# Patient Record
Sex: Female | Born: 1940
Health system: Southern US, Community
[De-identification: ages and names within clinical notes are randomized; demographics above are authoritative.]

## PROBLEM LIST (undated history)

## (undated) DIAGNOSIS — D229 Melanocytic nevi, unspecified: Secondary | ICD-10-CM

## (undated) DIAGNOSIS — D039 Melanoma in situ, unspecified: Secondary | ICD-10-CM

## (undated) DIAGNOSIS — M1990 Unspecified osteoarthritis, unspecified site: Secondary | ICD-10-CM

## (undated) DIAGNOSIS — C4491 Basal cell carcinoma of skin, unspecified: Secondary | ICD-10-CM

## (undated) DIAGNOSIS — C439 Malignant melanoma of skin, unspecified: Secondary | ICD-10-CM

## (undated) DIAGNOSIS — E785 Hyperlipidemia, unspecified: Secondary | ICD-10-CM

## (undated) DIAGNOSIS — I10 Essential (primary) hypertension: Secondary | ICD-10-CM

## (undated) HISTORY — DX: Basal cell carcinoma of skin, unspecified: C44.91

## (undated) HISTORY — DX: Essential (primary) hypertension: I10

## (undated) HISTORY — PX: REPLACEMENT TOTAL HIP W/  RESURFACING IMPLANTS: SUR1222

## (undated) HISTORY — DX: Unspecified osteoarthritis, unspecified site: M19.90

## (undated) HISTORY — PX: REPLACEMENT TOTAL KNEE: SUR1224

## (undated) HISTORY — DX: Hyperlipidemia, unspecified: E78.5

---
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
## 1963-06-06 HISTORY — PX: TONSILLECTOMY: SUR1361

## 1980-06-05 HISTORY — PX: DEBRIDEMENT TENNIS ELBOW: SHX1442

## 1997-07-21 ENCOUNTER — Other Ambulatory Visit: Admission: RE | Admit: 1997-07-21 | Discharge: 1997-07-21 | Payer: Self-pay | Admitting: Gynecology

## 1998-08-04 ENCOUNTER — Other Ambulatory Visit: Admission: RE | Admit: 1998-08-04 | Discharge: 1998-08-04 | Payer: Self-pay | Admitting: Gynecology

## 1999-07-06 ENCOUNTER — Encounter: Admission: RE | Admit: 1999-07-06 | Discharge: 1999-07-06 | Payer: Self-pay | Admitting: Gynecology

## 1999-07-06 ENCOUNTER — Encounter: Payer: Self-pay | Admitting: Gynecology

## 1999-07-18 ENCOUNTER — Other Ambulatory Visit: Admission: RE | Admit: 1999-07-18 | Discharge: 1999-07-18 | Payer: Self-pay | Admitting: Gynecology

## 1999-07-26 DIAGNOSIS — D039 Melanoma in situ, unspecified: Secondary | ICD-10-CM

## 1999-07-26 HISTORY — DX: Melanoma in situ, unspecified: D03.9

## 2000-07-09 ENCOUNTER — Encounter: Payer: Self-pay | Admitting: Gynecology

## 2000-07-09 ENCOUNTER — Encounter: Admission: RE | Admit: 2000-07-09 | Discharge: 2000-07-09 | Payer: Self-pay | Admitting: Gynecology

## 2000-07-26 ENCOUNTER — Other Ambulatory Visit: Admission: RE | Admit: 2000-07-26 | Discharge: 2000-07-26 | Payer: Self-pay | Admitting: Gynecology

## 2001-06-11 ENCOUNTER — Ambulatory Visit (HOSPITAL_COMMUNITY): Admission: RE | Admit: 2001-06-11 | Discharge: 2001-06-11 | Payer: Self-pay | Admitting: Internal Medicine

## 2001-06-11 ENCOUNTER — Encounter: Payer: Self-pay | Admitting: Internal Medicine

## 2001-07-11 ENCOUNTER — Encounter: Admission: RE | Admit: 2001-07-11 | Discharge: 2001-07-11 | Payer: Self-pay | Admitting: Gynecology

## 2001-07-11 ENCOUNTER — Encounter: Payer: Self-pay | Admitting: Gynecology

## 2001-11-12 ENCOUNTER — Other Ambulatory Visit: Admission: RE | Admit: 2001-11-12 | Discharge: 2001-11-12 | Payer: Self-pay | Admitting: Gynecology

## 2002-07-14 ENCOUNTER — Encounter: Payer: Self-pay | Admitting: Gynecology

## 2002-07-14 ENCOUNTER — Encounter: Admission: RE | Admit: 2002-07-14 | Discharge: 2002-07-14 | Payer: Self-pay | Admitting: Gynecology

## 2002-12-16 ENCOUNTER — Other Ambulatory Visit: Admission: RE | Admit: 2002-12-16 | Discharge: 2002-12-16 | Payer: Self-pay | Admitting: Gynecology

## 2003-07-16 ENCOUNTER — Encounter: Admission: RE | Admit: 2003-07-16 | Discharge: 2003-07-16 | Payer: Self-pay | Admitting: Gynecology

## 2003-12-21 ENCOUNTER — Other Ambulatory Visit: Admission: RE | Admit: 2003-12-21 | Discharge: 2003-12-21 | Payer: Self-pay | Admitting: Gynecology

## 2004-07-27 ENCOUNTER — Encounter: Admission: RE | Admit: 2004-07-27 | Discharge: 2004-07-27 | Payer: Self-pay | Admitting: Gynecology

## 2005-01-05 ENCOUNTER — Other Ambulatory Visit: Admission: RE | Admit: 2005-01-05 | Discharge: 2005-01-05 | Payer: Self-pay | Admitting: Gynecology

## 2005-04-19 ENCOUNTER — Ambulatory Visit (HOSPITAL_COMMUNITY): Admission: RE | Admit: 2005-04-19 | Discharge: 2005-04-19 | Payer: Self-pay | Admitting: Internal Medicine

## 2005-07-10 ENCOUNTER — Other Ambulatory Visit: Admission: RE | Admit: 2005-07-10 | Discharge: 2005-07-10 | Payer: Self-pay | Admitting: Gynecology

## 2005-07-28 ENCOUNTER — Encounter: Admission: RE | Admit: 2005-07-28 | Discharge: 2005-07-28 | Payer: Self-pay | Admitting: Gynecology

## 2005-09-06 ENCOUNTER — Ambulatory Visit (HOSPITAL_BASED_OUTPATIENT_CLINIC_OR_DEPARTMENT_OTHER): Admission: RE | Admit: 2005-09-06 | Discharge: 2005-09-06 | Payer: Self-pay | Admitting: Orthopedic Surgery

## 2005-09-06 ENCOUNTER — Encounter (INDEPENDENT_AMBULATORY_CARE_PROVIDER_SITE_OTHER): Payer: Self-pay | Admitting: *Deleted

## 2006-07-30 ENCOUNTER — Encounter: Admission: RE | Admit: 2006-07-30 | Discharge: 2006-07-30 | Payer: Self-pay | Admitting: Gynecology

## 2007-01-28 ENCOUNTER — Ambulatory Visit (HOSPITAL_COMMUNITY): Admission: RE | Admit: 2007-01-28 | Discharge: 2007-01-28 | Payer: Self-pay | Admitting: Gynecology

## 2007-08-06 ENCOUNTER — Encounter: Admission: RE | Admit: 2007-08-06 | Discharge: 2007-08-06 | Payer: Self-pay | Admitting: Gynecology

## 2008-06-05 HISTORY — PX: GANGLION CYST EXCISION: SHX1691

## 2008-08-07 ENCOUNTER — Encounter: Admission: RE | Admit: 2008-08-07 | Discharge: 2008-08-07 | Payer: Self-pay | Admitting: Internal Medicine

## 2009-08-10 ENCOUNTER — Encounter: Admission: RE | Admit: 2009-08-10 | Discharge: 2009-08-10 | Payer: Self-pay | Admitting: Internal Medicine

## 2010-06-26 ENCOUNTER — Encounter: Payer: Self-pay | Admitting: Gynecology

## 2010-07-25 ENCOUNTER — Other Ambulatory Visit: Payer: Self-pay | Admitting: Gynecology

## 2010-07-25 DIAGNOSIS — Z1231 Encounter for screening mammogram for malignant neoplasm of breast: Secondary | ICD-10-CM

## 2010-08-12 ENCOUNTER — Ambulatory Visit
Admission: RE | Admit: 2010-08-12 | Discharge: 2010-08-12 | Disposition: A | Discharge status: Home / Self Care | Payer: Medicare Other | Source: Ambulatory Visit | Attending: Gynecology | Admitting: Gynecology

## 2010-08-12 DIAGNOSIS — Z1231 Encounter for screening mammogram for malignant neoplasm of breast: Secondary | ICD-10-CM

## 2010-10-21 NOTE — Op Note (Signed)
NAMEJAMISEN, Ruth Murray             ACCOUNT NO.:  [id number]   MEDICAL RECORD NO.:  [id number]          PATIENT TYPE:  AMB   LOCATION:  DSC                          FACILITY:  [facility]   PHYSICIAN:  Cindee Salt, M.D.       DATE OF BIRTH:  1940/08/31   DATE OF PROCEDURE:  DATE OF DISCHARGE:                               OPERATIVE REPORT   PREOPERATIVE DIAGNOSIS:  Suzette Battiest with cyst, left wrist.   POSTOPERATIVE DIAGNOSIS:  Suzette Battiest with cyst, left wrist.   OPERATION:  Excision, cyst, release of first dorsal compartment, left  wrist.   SURGEON:  Cindee Salt, M.D.   ASSISTANTCarolyne Fiscal, R.N.   ANESTHESIA:  Upper arm IV regional.   DATE OF OPERATION:  09/06/2005.   HISTORY:  The patient is a 71 year old female with a history of de  Quervain's tendinitis.  She has developed multiple recurrence after  injections and a cyst on the dorsal aspect of the first dorsal  compartment.  She has not seen any relief with conservative treatment.  She is brought in for release and excision of the cyst.  This will be  performed as an outpatient under regional anesthesia.  Questions are  answered, prior to surgery the area marked by the patient and surgeon.   PROCEDURE:  The patient is brought to the operating room where an upper  arm IV regional anesthetic was carried out without difficulty.  She was  prepped using DuraPrep, supine position, left arm free.  A longitudinal  incision was made directly over the first dorsal compartment,  carried  down through subcutaneous tissue.  Bleeders were electrocauterized.  The  radial nerve was identified and protected.  Significant scarring was  present about the cyst. With blunt and sharp dissection this was  dissected free protecting the overlying structures.  The cyst was then  excised, sent to pathology.  Following the stalk down into the first  dorsal compartment, the first dorsal compartment was found to be  extremely thickened.  This was released  on its most dorsal aspect.  Significant tenosynovitis was present.  This area was debrided. Some  fraying of the tendons was noted.  This area was also debrided. The  wound was then irrigated. The compartment was then reapproximated over  the tendons and the skin closed with a subcuticular 4-0 Monocryl suture.  Steri-Strips were applied.  A sterile compressive dressing and thumb  spica splint applied.  The patient tolerated the procedure well and was  taken to the recovery for observation in satisfactory condition.  She is  discharged home to return to the [facility] in 1 week on  Vicodin.           ______________________________  Cindee Salt, M.D.    GK/MEDQ  D:  09/06/2005  T:  09/06/2005  Job:  [id number]

## 2010-10-21 NOTE — Procedures (Signed)
[facility]  Patient:    Ruth Murray, Ruth Murray Visit Number: [id number] MRN: [id number]          Service Type: OUT Location: RAD Attending Physician:  Carylon Perches Dictated by:   Carylon Perches, M.D. Proc. Date: 06/11/01 Admit Date:  06/11/2001                                Stress Test  PROCEDURE:  Cardiolite stress test.  OPERATOR:  Dr. Carylon Perches.  DESCRIPTION OF PROCEDURE:  Ms. Hackenberg exercised 10 minutes and 20 seconds (one minute and 20 seconds at stage 4 of the Bruce protocol), attaining a maximal heart rate of 160 (99% of the age predicted maximal heart rate), at a work load of 12.9 METS, and discontinued exercise due to fatigue.  There are no symptoms of chest pain.  There were no arrhythmias.  There were no ST segment changes diagnostic of ischemia.  IMPRESSION:  No evidence of exercise-induced ischemia.  Cardiolite images pending. Dictated by:   Carylon Perches, M.D. Attending Physician:  Carylon Perches DD:  06/11/01 TD:  06/11/01 Job: [id number]

## 2011-04-20 ENCOUNTER — Other Ambulatory Visit: Payer: Self-pay | Admitting: Gynecology

## 2011-07-21 ENCOUNTER — Other Ambulatory Visit: Payer: Self-pay | Admitting: Gynecology

## 2011-07-21 DIAGNOSIS — Z1231 Encounter for screening mammogram for malignant neoplasm of breast: Secondary | ICD-10-CM

## 2011-08-14 ENCOUNTER — Ambulatory Visit: Payer: BC Managed Care – PPO

## 2011-08-15 ENCOUNTER — Ambulatory Visit
Admission: RE | Admit: 2011-08-15 | Discharge: 2011-08-15 | Disposition: A | Discharge status: Home / Self Care | Payer: BC Managed Care – PPO | Source: Ambulatory Visit | Attending: Gynecology | Admitting: Gynecology

## 2011-08-15 DIAGNOSIS — Z1231 Encounter for screening mammogram for malignant neoplasm of breast: Secondary | ICD-10-CM

## 2011-12-15 ENCOUNTER — Encounter: Payer: Self-pay | Admitting: Internal Medicine

## 2012-01-26 ENCOUNTER — Ambulatory Visit (AMBULATORY_SURGERY_CENTER): Payer: Medicare Other | Admitting: *Deleted

## 2012-01-26 VITALS — Ht 67.0 in | Wt 149.0 lb

## 2012-01-26 DIAGNOSIS — Z1211 Encounter for screening for malignant neoplasm of colon: Secondary | ICD-10-CM

## 2012-01-26 MED ORDER — MOVIPREP 100 G PO SOLR: ORAL | Status: DC

## 2012-02-09 ENCOUNTER — Ambulatory Visit (AMBULATORY_SURGERY_CENTER): Payer: Medicare Other | Admitting: Internal Medicine

## 2012-02-09 ENCOUNTER — Encounter: Payer: Self-pay | Admitting: Internal Medicine

## 2012-02-09 VITALS — BP 148/84 | HR 60 | Temp 98.1°F | Resp 16 | Ht 67.0 in | Wt 149.0 lb

## 2012-02-09 DIAGNOSIS — Z1211 Encounter for screening for malignant neoplasm of colon: Secondary | ICD-10-CM

## 2012-02-09 MED ORDER — SODIUM CHLORIDE 0.9 % IV SOLN: 500.0000 mL | INTRAVENOUS | Status: DC

## 2012-02-09 NOTE — Progress Notes (Signed)
Propofol given and oxygen managed per CRNA

## 2012-02-09 NOTE — Op Note (Signed)
[facility] [address] [place], [address]   COLONOSCOPY PROCEDURE REPORT  PATIENT: Ruth, Murray  MR#: [id number] BIRTHDATE: 11/30/1940 , 71  yrs. old GENDER: Female ENDOSCOPIST: Hart Carwin, MD REFERRED XB:JYNW Hall, M.D.  ,Dr Lodema Hong. PROCEDURE DATE:  02/09/2012 PROCEDURE:   Colonoscopy, screening ASA CLASS:   Class II INDICATIONS:screening MEDICATIONS: MAC sedation, administered by CRNA and Propofol (Diprivan) 200 mg IV  DESCRIPTION OF PROCEDURE:   After the risks benefits and alternatives of the procedure were thoroughly explained, informed consent was obtained.  A digital rectal exam revealed no abnormalities of the rectum.   The LB PCF-H180AL [id number]  endoscope was introduced through the anus and advanced to the cecum, which was identified by both the appendix and ileocecal valve. No adverse events experienced.   The quality of the prep was good, using MoviPrep  The instrument was then slowly withdrawn as the colon was fully examined.      COLON FINDINGS: A normal appearing cecum, ileocecal valve, and appendiceal orifice were identified.  The ascending, hepatic flexure, transverse, splenic flexure, descending, sigmoid colon and rectum appeared unremarkable.  No polyps or cancers were seen. There were few scattered diverticuli in the sigmoid colon Retroflexed views revealed no abnormalities.  .      The scope was withdrawn and the procedure completed. COMPLICATIONS: There were no complications.  ENDOSCOPIC IMPRESSION: Normal colon , minimal sigmoid diverticulosis  RECOMMENDATIONS: High fiber diet Recall colonoscopy suggested 10 years if medically stable  eSigned:  Hart Carwin, MD 02/09/2012 1:03 PM   cc:

## 2012-02-09 NOTE — Progress Notes (Signed)
Patient did not experience any of the following events: a burn prior to discharge; a fall within the facility; wrong site/side/patient/procedure/implant event; or a hospital transfer or hospital admission upon discharge from the facility. (G8907) Patient did not have preoperative order for IV antibiotic SSI prophylaxis. (G8918)  

## 2012-02-09 NOTE — Patient Instructions (Addendum)

## 2012-02-12 ENCOUNTER — Telehealth: Payer: Self-pay | Admitting: *Deleted

## 2012-02-12 NOTE — Telephone Encounter (Signed)
Message left

## 2012-03-09 ENCOUNTER — Other Ambulatory Visit: Payer: Self-pay | Admitting: Internal Medicine

## 2012-04-25 ENCOUNTER — Other Ambulatory Visit (HOSPITAL_COMMUNITY): Payer: Self-pay | Admitting: Internal Medicine

## 2012-04-25 DIAGNOSIS — Z139 Encounter for screening, unspecified: Secondary | ICD-10-CM

## 2012-06-12 DIAGNOSIS — D229 Melanocytic nevi, unspecified: Secondary | ICD-10-CM

## 2012-06-12 HISTORY — DX: Melanocytic nevi, unspecified: D22.9

## 2012-07-09 ENCOUNTER — Other Ambulatory Visit (HOSPITAL_COMMUNITY): Payer: Self-pay | Admitting: Gynecology

## 2012-07-09 DIAGNOSIS — M810 Age-related osteoporosis without current pathological fracture: Secondary | ICD-10-CM

## 2012-07-11 ENCOUNTER — Other Ambulatory Visit: Payer: Self-pay | Admitting: Gynecology

## 2012-07-11 DIAGNOSIS — Z1231 Encounter for screening mammogram for malignant neoplasm of breast: Secondary | ICD-10-CM

## 2012-07-15 ENCOUNTER — Ambulatory Visit (HOSPITAL_COMMUNITY)
Admission: RE | Admit: 2012-07-15 | Discharge: 2012-07-15 | Disposition: A | Discharge status: Home / Self Care | Payer: Medicare Other | Source: Ambulatory Visit | Attending: Gynecology | Admitting: Gynecology

## 2012-07-15 DIAGNOSIS — M899 Disorder of bone, unspecified: Secondary | ICD-10-CM | POA: Insufficient documentation

## 2012-07-15 DIAGNOSIS — M810 Age-related osteoporosis without current pathological fracture: Secondary | ICD-10-CM

## 2012-07-15 DIAGNOSIS — Z780 Asymptomatic menopausal state: Secondary | ICD-10-CM | POA: Insufficient documentation

## 2012-08-16 ENCOUNTER — Ambulatory Visit
Admission: RE | Admit: 2012-08-16 | Discharge: 2012-08-16 | Disposition: A | Discharge status: Home / Self Care | Payer: Medicare Other | Source: Ambulatory Visit | Attending: Gynecology | Admitting: Gynecology

## 2012-08-16 DIAGNOSIS — Z1231 Encounter for screening mammogram for malignant neoplasm of breast: Secondary | ICD-10-CM

## 2012-09-12 ENCOUNTER — Ambulatory Visit (INDEPENDENT_AMBULATORY_CARE_PROVIDER_SITE_OTHER): Payer: Medicare Other | Admitting: Otolaryngology

## 2012-09-12 DIAGNOSIS — H903 Sensorineural hearing loss, bilateral: Secondary | ICD-10-CM

## 2012-12-04 ENCOUNTER — Ambulatory Visit (HOSPITAL_COMMUNITY)
Admission: RE | Admit: 2012-12-04 | Discharge: 2012-12-04 | Disposition: A | Discharge status: Home / Self Care | Payer: Medicare Other | Source: Ambulatory Visit | Attending: *Deleted | Admitting: *Deleted

## 2012-12-04 ENCOUNTER — Other Ambulatory Visit (HOSPITAL_COMMUNITY): Payer: Self-pay | Admitting: *Deleted

## 2012-12-04 DIAGNOSIS — T1490XA Injury, unspecified, initial encounter: Secondary | ICD-10-CM

## 2012-12-04 DIAGNOSIS — M79609 Pain in unspecified limb: Secondary | ICD-10-CM | POA: Insufficient documentation

## 2012-12-04 DIAGNOSIS — S6990XA Unspecified injury of unspecified wrist, hand and finger(s), initial encounter: Secondary | ICD-10-CM | POA: Insufficient documentation

## 2012-12-04 DIAGNOSIS — S6980XA Other specified injuries of unspecified wrist, hand and finger(s), initial encounter: Secondary | ICD-10-CM | POA: Insufficient documentation

## 2012-12-04 DIAGNOSIS — X58XXXA Exposure to other specified factors, initial encounter: Secondary | ICD-10-CM | POA: Insufficient documentation

## 2013-01-01 ENCOUNTER — Other Ambulatory Visit (HOSPITAL_COMMUNITY): Payer: Self-pay | Admitting: Internal Medicine

## 2013-01-01 DIAGNOSIS — M549 Dorsalgia, unspecified: Secondary | ICD-10-CM

## 2013-01-01 DIAGNOSIS — IMO0002 Reserved for concepts with insufficient information to code with codable children: Secondary | ICD-10-CM

## 2013-01-02 ENCOUNTER — Other Ambulatory Visit: Payer: Self-pay | Admitting: Internal Medicine

## 2013-01-02 DIAGNOSIS — IMO0002 Reserved for concepts with insufficient information to code with codable children: Secondary | ICD-10-CM

## 2013-01-02 DIAGNOSIS — M549 Dorsalgia, unspecified: Secondary | ICD-10-CM

## 2013-01-03 ENCOUNTER — Ambulatory Visit (HOSPITAL_COMMUNITY): Payer: Medicare Other

## 2013-01-11 ENCOUNTER — Ambulatory Visit
Admission: RE | Admit: 2013-01-11 | Discharge: 2013-01-11 | Disposition: A | Discharge status: Home / Self Care | Payer: Medicare Other | Source: Ambulatory Visit | Attending: Internal Medicine | Admitting: Internal Medicine

## 2013-01-11 DIAGNOSIS — IMO0002 Reserved for concepts with insufficient information to code with codable children: Secondary | ICD-10-CM

## 2013-01-11 DIAGNOSIS — M549 Dorsalgia, unspecified: Secondary | ICD-10-CM

## 2013-07-23 ENCOUNTER — Other Ambulatory Visit: Payer: Self-pay

## 2013-07-23 DIAGNOSIS — Z1231 Encounter for screening mammogram for malignant neoplasm of breast: Secondary | ICD-10-CM

## 2013-08-19 ENCOUNTER — Ambulatory Visit: Payer: Medicare Other

## 2013-09-03 ENCOUNTER — Ambulatory Visit: Payer: Medicare Other

## 2013-09-09 ENCOUNTER — Ambulatory Visit
Admission: RE | Admit: 2013-09-09 | Discharge: 2013-09-09 | Disposition: A | Discharge status: Home / Self Care | Payer: 59 | Source: Ambulatory Visit

## 2013-09-09 DIAGNOSIS — Z1231 Encounter for screening mammogram for malignant neoplasm of breast: Secondary | ICD-10-CM

## 2014-06-04 ENCOUNTER — Other Ambulatory Visit (HOSPITAL_COMMUNITY): Payer: Self-pay | Admitting: Internal Medicine

## 2014-06-04 DIAGNOSIS — M8580 Other specified disorders of bone density and structure, unspecified site: Secondary | ICD-10-CM

## 2014-07-16 ENCOUNTER — Ambulatory Visit (HOSPITAL_COMMUNITY)
Admission: RE | Admit: 2014-07-16 | Discharge: 2014-07-16 | Disposition: A | Discharge status: Home / Self Care | Payer: Medicare Other | Source: Ambulatory Visit | Attending: Internal Medicine | Admitting: Internal Medicine

## 2014-07-16 DIAGNOSIS — M899 Disorder of bone, unspecified: Secondary | ICD-10-CM | POA: Diagnosis not present

## 2014-07-16 DIAGNOSIS — M8580 Other specified disorders of bone density and structure, unspecified site: Secondary | ICD-10-CM

## 2014-08-03 ENCOUNTER — Other Ambulatory Visit: Payer: Self-pay

## 2014-08-03 DIAGNOSIS — Z1231 Encounter for screening mammogram for malignant neoplasm of breast: Secondary | ICD-10-CM

## 2014-09-11 ENCOUNTER — Ambulatory Visit
Admission: RE | Admit: 2014-09-11 | Discharge: 2014-09-11 | Disposition: A | Discharge status: Home / Self Care | Payer: Medicare Other | Source: Ambulatory Visit

## 2014-09-11 DIAGNOSIS — Z1231 Encounter for screening mammogram for malignant neoplasm of breast: Secondary | ICD-10-CM

## 2015-02-04 ENCOUNTER — Ambulatory Visit (INDEPENDENT_AMBULATORY_CARE_PROVIDER_SITE_OTHER): Payer: Medicare Other | Admitting: Otolaryngology

## 2015-02-04 DIAGNOSIS — H903 Sensorineural hearing loss, bilateral: Secondary | ICD-10-CM

## 2015-02-04 DIAGNOSIS — H6121 Impacted cerumen, right ear: Secondary | ICD-10-CM

## 2015-02-12 ENCOUNTER — Other Ambulatory Visit (HOSPITAL_COMMUNITY): Payer: Self-pay | Admitting: Internal Medicine

## 2015-02-12 DIAGNOSIS — M25562 Pain in left knee: Secondary | ICD-10-CM

## 2015-02-19 ENCOUNTER — Ambulatory Visit (HOSPITAL_COMMUNITY): Admission: RE | Admit: 2015-02-19 | Payer: Medicare Other | Source: Ambulatory Visit

## 2015-02-22 ENCOUNTER — Ambulatory Visit (HOSPITAL_COMMUNITY)
Admission: RE | Admit: 2015-02-22 | Discharge: 2015-02-22 | Disposition: A | Discharge status: Home / Self Care | Payer: Medicare Other | Source: Ambulatory Visit | Attending: Internal Medicine | Admitting: Internal Medicine

## 2015-02-22 DIAGNOSIS — M25462 Effusion, left knee: Secondary | ICD-10-CM | POA: Diagnosis not present

## 2015-02-22 DIAGNOSIS — M2240 Chondromalacia patellae, unspecified knee: Secondary | ICD-10-CM | POA: Insufficient documentation

## 2015-02-22 DIAGNOSIS — M25562 Pain in left knee: Secondary | ICD-10-CM | POA: Diagnosis present

## 2015-02-22 DIAGNOSIS — M23222 Derangement of posterior horn of medial meniscus due to old tear or injury, left knee: Secondary | ICD-10-CM | POA: Insufficient documentation

## 2015-02-22 DIAGNOSIS — M7122 Synovial cyst of popliteal space [Baker], left knee: Secondary | ICD-10-CM | POA: Diagnosis not present

## 2015-03-04 ENCOUNTER — Inpatient Hospital Stay (HOSPITAL_COMMUNITY): Admission: RE | Admit: 2015-03-04 | Payer: Medicare Other | Source: Ambulatory Visit

## 2015-05-18 ENCOUNTER — Other Ambulatory Visit: Payer: Self-pay | Admitting: Dermatology

## 2015-08-18 ENCOUNTER — Other Ambulatory Visit: Payer: Self-pay

## 2015-08-18 DIAGNOSIS — Z1231 Encounter for screening mammogram for malignant neoplasm of breast: Secondary | ICD-10-CM

## 2015-09-13 ENCOUNTER — Ambulatory Visit: Payer: Medicare Other

## 2015-09-23 ENCOUNTER — Ambulatory Visit
Admission: RE | Admit: 2015-09-23 | Discharge: 2015-09-23 | Disposition: A | Discharge status: Home / Self Care | Payer: Medicare Other | Source: Ambulatory Visit

## 2015-09-23 DIAGNOSIS — Z1231 Encounter for screening mammogram for malignant neoplasm of breast: Secondary | ICD-10-CM

## 2015-09-27 ENCOUNTER — Other Ambulatory Visit: Payer: Self-pay | Admitting: Obstetrics & Gynecology

## 2015-09-27 DIAGNOSIS — R928 Other abnormal and inconclusive findings on diagnostic imaging of breast: Secondary | ICD-10-CM

## 2015-10-05 ENCOUNTER — Ambulatory Visit
Admission: RE | Admit: 2015-10-05 | Discharge: 2015-10-05 | Disposition: A | Discharge status: Home / Self Care | Payer: Medicare Other | Source: Ambulatory Visit | Attending: Obstetrics & Gynecology | Admitting: Obstetrics & Gynecology

## 2015-10-05 DIAGNOSIS — R928 Other abnormal and inconclusive findings on diagnostic imaging of breast: Secondary | ICD-10-CM

## 2016-08-07 ENCOUNTER — Other Ambulatory Visit: Payer: Self-pay | Admitting: Obstetrics & Gynecology

## 2016-08-08 LAB — CYTOLOGY - PAP

## 2016-09-12 ENCOUNTER — Other Ambulatory Visit: Payer: Self-pay | Admitting: Internal Medicine

## 2016-09-12 DIAGNOSIS — Z1231 Encounter for screening mammogram for malignant neoplasm of breast: Secondary | ICD-10-CM

## 2016-09-29 ENCOUNTER — Ambulatory Visit
Admission: RE | Admit: 2016-09-29 | Discharge: 2016-09-29 | Disposition: A | Discharge status: Home / Self Care | Payer: Medicare Other | Source: Ambulatory Visit | Attending: Internal Medicine | Admitting: Internal Medicine

## 2016-09-29 DIAGNOSIS — Z1231 Encounter for screening mammogram for malignant neoplasm of breast: Secondary | ICD-10-CM

## 2016-10-09 ENCOUNTER — Ambulatory Visit (INDEPENDENT_AMBULATORY_CARE_PROVIDER_SITE_OTHER): Payer: Medicare Other | Admitting: Otolaryngology

## 2016-10-09 DIAGNOSIS — H6121 Impacted cerumen, right ear: Secondary | ICD-10-CM

## 2017-07-12 ENCOUNTER — Other Ambulatory Visit: Payer: Self-pay | Admitting: Internal Medicine

## 2017-07-12 DIAGNOSIS — Z1231 Encounter for screening mammogram for malignant neoplasm of breast: Secondary | ICD-10-CM

## 2017-09-05 ENCOUNTER — Ambulatory Visit (INDEPENDENT_AMBULATORY_CARE_PROVIDER_SITE_OTHER): Payer: Medicare Other | Admitting: Orthopaedic Surgery

## 2017-09-05 ENCOUNTER — Ambulatory Visit (INDEPENDENT_AMBULATORY_CARE_PROVIDER_SITE_OTHER): Payer: Self-pay

## 2017-09-05 ENCOUNTER — Encounter (INDEPENDENT_AMBULATORY_CARE_PROVIDER_SITE_OTHER): Payer: Self-pay | Admitting: Orthopaedic Surgery

## 2017-09-05 VITALS — BP 132/60 | HR 56 | Resp 16 | Ht 66.0 in | Wt 144.0 lb

## 2017-09-05 DIAGNOSIS — M79601 Pain in right arm: Secondary | ICD-10-CM

## 2017-09-05 MED ORDER — BUPIVACAINE HCL 0.5 % IJ SOLN
2.0000 mL | INTRAMUSCULAR | Status: AC | PRN
  Administered 2017-09-05: 2 mL via INTRA_ARTICULAR

## 2017-09-05 MED ORDER — METHYLPREDNISOLONE ACETATE 40 MG/ML IJ SUSP
80.0000 mg | INTRAMUSCULAR | Status: AC | PRN
  Administered 2017-09-05: 80 mg

## 2017-09-05 MED ORDER — LIDOCAINE HCL 2 % IJ SOLN
2.0000 mL | INTRAMUSCULAR | Status: AC | PRN
  Administered 2017-09-05: 2 mL

## 2017-09-05 NOTE — Progress Notes (Signed)
Office Visit Note   Patient: Ruth Murray           Date of Birth: May 31, 1941           MRN: [id number] Visit Date: 09/05/2017              Requested by: Ruth Squibb, MD [place], [place] [address] PCP: Ruth Squibb, MD   Assessment & Plan: Visit Diagnoses:  1. Pain of right upper extremity     Plan: Right shoulder pain with insidious onset approximately 2 months ago with multiple diagnostic possibilities.  Will try subacromial cortisone injection and monitor her response.  Consider MRI scan if  minimal response  Follow-Up Instructions: Return if symptoms worsen or fail to improve.   Orders:  Orders Placed This Encounter  Procedures  . Large Joint Inj: R subacromial bursa  . XR Shoulder Right   No orders of the defined types were placed in this encounter.     Procedures: Large Joint Inj: R subacromial bursa on 09/05/2017 11:33 AM Indications: pain and diagnostic evaluation Details: 25 G 1.5 in needle, anterolateral approach  Arthrogram: No  Medications: 2 mL lidocaine 2 %; 2 mL bupivacaine 0.5 %; 80 mg methylPREDNISolone acetate 40 MG/ML Consent was given by the patient. Immediately prior to procedure a time out was called to verify the correct patient, procedure, equipment, support staff and site/side marked as required. Patient was prepped and draped in the usual sterile fashion.       Clinical Data: No additional findings.   Subjective: Chief Complaint  Patient presents with  . Right Arm - Pain    PAIN FOR 2 MONTHS POSSIBLY FROM PUSHING FILES AROUND, PAINFUL TO REACH UNABLE TO SLEEP ON IT  . New Patient (Initial Visit)    PAIN FOR 2 MONTHS POSSIBLY FROM PUSHING FILES AROUND, PAINFUL TO REACH UNABLE TO SLEEP ON IT  Ruth Murray relates insidious onset of right shoulder pain approximately 2 months ago.  She notes that she was pushing on some files that may have contributed to her pain but no obvious injury or trauma.  She is having  difficulty reaching "back" and has difficulty sleeping on her shoulder.  Tried ice and heat and meloxicam without much relief.  He denies any neck pain.  She has not had any numbness or tingling  HPI  Review of Systems  Constitutional: Negative for fatigue and fever.  HENT: Negative for ear pain.   Eyes: Negative for pain.  Respiratory: Negative for cough and shortness of breath.   Cardiovascular: Negative for leg swelling.  Gastrointestinal: Negative for blood in stool and constipation.  Genitourinary: Negative for difficulty urinating.  Musculoskeletal: Positive for back pain. Negative for neck pain.  Skin: Negative for rash and wound.  Allergic/Immunologic: Negative for food allergies.  Neurological: Negative for dizziness, weakness, light-headedness, numbness and headaches.  Hematological: Does not bruise/bleed easily.  Psychiatric/Behavioral: Positive for sleep disturbance.     Objective: Vital Signs: BP 132/60 (BP Location: Left Arm, Patient Position: Sitting, Cuff Size: Normal)   Pulse (!) 56   Resp 16   Ht 5\' 6"  (1.676 m)   Wt 144 lb (65.3 kg)   BMI 23.24 kg/m   Physical Exam  Ortho Exam awake alert and oriented x3.  Comfortable sitting.  No pain referred to right shoulder with range of motion of the cervical spine.  Minimally positive impingement testing.  Minimally positive test.  Good strength with internal and external  rotation.  Biceps intact.  Hypertrophic changes about the acromioclavicular joint but without any significant pain.  Some pain there is subacromial region.  Of the shoulder the subacromial space.  No popping or clicking  Specialty Comments:  No specialty comments available.  Imaging: Xr Shoulder Right  Result Date: 09/05/2017 As of the right shoulder obtained in several projections.  The humeral head is centered about the glenoid.  No ectopic calcification.  Normal space between the humeral head and the acromion.  Hypertrophic degenerative changes  about the acromioclavicular joint.  No degenerative changes at the glenohumeral joint    PMFS History: There are no active problems to display for this patient.  Past Medical History:  Diagnosis Date  . Arthritis   . Hyperlipidemia   . Hypertension     Family History  Problem Relation Age of Onset  . Colon cancer Neg Hx   . Stomach cancer Neg Hx   . Esophageal cancer Neg Hx   . Rectal cancer Neg Hx     Past Surgical History:  Procedure Laterality Date  . [place]   right  . GANGLION CYST EXCISION  2010   wrist  . TONSILLECTOMY  1965   Social History   Occupational History  . Not on file  Tobacco Use  . Smoking status: Former Smoker    Last attempt to quit: 01/26/1972    Years since quitting: 45.6  . Smokeless tobacco: Never Used  Substance and Sexual Activity  . Alcohol use: Yes    Alcohol/week: 1.5 oz    Types: 3 Standard drinks or equivalent per week  . Drug use: Not Currently  . Sexual activity: Not on file

## 2017-10-01 ENCOUNTER — Ambulatory Visit
Admission: RE | Admit: 2017-10-01 | Discharge: 2017-10-01 | Disposition: A | Discharge status: Home / Self Care | Payer: Medicare Other | Source: Ambulatory Visit | Attending: Internal Medicine | Admitting: Internal Medicine

## 2017-10-01 DIAGNOSIS — Z1231 Encounter for screening mammogram for malignant neoplasm of breast: Secondary | ICD-10-CM

## 2017-10-08 ENCOUNTER — Ambulatory Visit (INDEPENDENT_AMBULATORY_CARE_PROVIDER_SITE_OTHER): Payer: Medicare Other | Admitting: Otolaryngology

## 2017-10-08 DIAGNOSIS — H6122 Impacted cerumen, left ear: Secondary | ICD-10-CM | POA: Diagnosis not present

## 2017-10-08 DIAGNOSIS — H903 Sensorineural hearing loss, bilateral: Secondary | ICD-10-CM

## 2017-10-11 ENCOUNTER — Ambulatory Visit (INDEPENDENT_AMBULATORY_CARE_PROVIDER_SITE_OTHER): Payer: Medicare Other | Admitting: Otolaryngology

## 2017-10-11 DIAGNOSIS — H903 Sensorineural hearing loss, bilateral: Secondary | ICD-10-CM | POA: Diagnosis not present

## 2017-10-11 DIAGNOSIS — H6122 Impacted cerumen, left ear: Secondary | ICD-10-CM | POA: Diagnosis not present

## 2017-12-19 ENCOUNTER — Encounter (INDEPENDENT_AMBULATORY_CARE_PROVIDER_SITE_OTHER): Payer: Self-pay | Admitting: Orthopaedic Surgery

## 2017-12-19 ENCOUNTER — Other Ambulatory Visit (INDEPENDENT_AMBULATORY_CARE_PROVIDER_SITE_OTHER): Payer: Self-pay | Admitting: Radiology

## 2017-12-19 ENCOUNTER — Ambulatory Visit (INDEPENDENT_AMBULATORY_CARE_PROVIDER_SITE_OTHER): Payer: Medicare Other | Admitting: Orthopaedic Surgery

## 2017-12-19 VITALS — BP 132/61 | HR 56 | Ht 66.0 in | Wt 144.0 lb

## 2017-12-19 DIAGNOSIS — M25511 Pain in right shoulder: Secondary | ICD-10-CM | POA: Diagnosis not present

## 2017-12-19 DIAGNOSIS — G8929 Other chronic pain: Secondary | ICD-10-CM

## 2017-12-19 MED ORDER — DIAZEPAM 5 MG PO TABS: ORAL_TABLET | ORAL | 0 refills | Status: DC

## 2017-12-19 NOTE — Progress Notes (Signed)
Office Visit Note   Patient: Ruth Murray           Date of Birth: 1940-11-23           MRN: [id number] Visit Date: 12/19/2017              Requested by: Celene Squibb, MD [place], [place] [address] PCP: Celene Squibb, MD   Assessment & Plan: Visit Diagnoses:  1. Chronic right shoulder pain     Plan: Impingement syndrome right shoulder with insidious onset approximately 5 months ago.  Has not responded to time and subacromial cortisone injection.  Will obtain MRI scan  Follow-Up Instructions: Return after MRI right shoulder.   Orders:  No orders of the defined types were placed in this encounter.  Meds ordered this encounter  Medications  . diazepam (VALIUM) 5 MG tablet    Sig: 1 PO 1 HR PRIOR TO MRI, I PO 30 MIN PRIOR TO MRI IF NEEDED    Dispense:  2 tablet    Refill:  0      Procedures: No procedures performed   Clinical Data: No additional findings.   Subjective: Chief Complaint  Patient presents with  . Follow-up    RE CURRENT R SHOULDER PAIN, HAD INJECTION ONLY HELPED FOR 2 WEEKS GRADUALLY GETTING WORSE  Ruth Murray has been experiencing right shoulder pain for at least 5 months.  I saw her in April with a diagnosis of impingement syndrome.  I injected the subacromial space that provided only minimal relief of her pain.  She does have compromise of her activities particularly overhead maneuvers.  She has not had any numbness or tingling.  Pain is localized to the anterior and lateral subacromial region and referred pain to the mid arm.  There is been no numbness or tingling or neck pain.  HPI  Review of Systems  Constitutional: Negative for fatigue and fever.  HENT: Negative for ear pain.   Eyes: Negative for pain.  Respiratory: Negative for cough and shortness of breath.   Cardiovascular: Negative for leg swelling.  Gastrointestinal: Negative for constipation and diarrhea.  Genitourinary: Negative for difficulty urinating.    Musculoskeletal: Negative for back pain and neck pain.  Skin: Negative for rash.  Allergic/Immunologic: Negative for food allergies.  Neurological: Positive for weakness.  Hematological: Does not bruise/bleed easily.  Psychiatric/Behavioral: Positive for sleep disturbance.     Objective: Vital Signs: BP 132/61 (BP Location: Left Arm, Patient Position: Sitting, Cuff Size: Normal)   Pulse (!) 56   Ht 5\' 6"  (1.676 m)   Wt 144 lb (65.3 kg)   BMI 23.24 kg/m   Physical Exam  Constitutional: She is oriented to person, place, and time. She appears well-developed and well-nourished.  HENT:  Mouth/Throat: Oropharynx is clear and moist.  Eyes: Pupils are equal, round, and reactive to light. EOM are normal.  Pulmonary/Chest: Effort normal.  Neurological: She is alert and oriented to person, place, and time.  Skin: Skin is warm and dry.  Psychiatric: She has a normal mood and affect. Her behavior is normal.    Ortho Exam awake alert and oriented x3.  Comfortable sitting.  Able to place right arm fully overhead but with a slightly circuitous motion.  Positive impingement on the extremes of internal and external rotation.  No crepitation.  Does have some local tenderness over the anterior subacromial region.  Biceps intact.  Slightly positive speed sign.  Good grip and good release.  Skin intact.  Painless range of motion of cervical spine .good strength  Specialty Comments:  No specialty comments available.  Imaging: No results found.   PMFS History: There are no active problems to display for this patient.  Past Medical History:  Diagnosis Date  . Arthritis   . Hyperlipidemia   . Hypertension     Family History  Problem Relation Age of Onset  . Colon cancer Neg Hx   . Stomach cancer Neg Hx   . Esophageal cancer Neg Hx   . Rectal cancer Neg Hx     Past Surgical History:  Procedure Laterality Date  . [place]   right  . GANGLION CYST EXCISION  2010    wrist  . TONSILLECTOMY  1965   Social History   Occupational History  . Not on file  Tobacco Use  . Smoking status: Former Smoker    Last attempt to quit: 01/26/1972    Years since quitting: 45.9  . Smokeless tobacco: Never Used  Substance and Sexual Activity  . Alcohol use: Yes    Alcohol/week: 1.8 oz    Types: 3 Standard drinks or equivalent per week  . Drug use: Not Currently  . Sexual activity: Not on file

## 2018-01-01 ENCOUNTER — Ambulatory Visit
Admission: RE | Admit: 2018-01-01 | Discharge: 2018-01-01 | Disposition: A | Discharge status: Home / Self Care | Payer: Medicare Other | Source: Ambulatory Visit | Attending: Orthopaedic Surgery | Admitting: Orthopaedic Surgery

## 2018-01-01 DIAGNOSIS — G8929 Other chronic pain: Secondary | ICD-10-CM

## 2018-01-01 DIAGNOSIS — M25511 Pain in right shoulder: Principal | ICD-10-CM

## 2018-01-09 ENCOUNTER — Ambulatory Visit (INDEPENDENT_AMBULATORY_CARE_PROVIDER_SITE_OTHER): Payer: Medicare Other | Admitting: Orthopaedic Surgery

## 2018-01-09 ENCOUNTER — Encounter (INDEPENDENT_AMBULATORY_CARE_PROVIDER_SITE_OTHER): Payer: Self-pay | Admitting: Orthopaedic Surgery

## 2018-01-09 ENCOUNTER — Other Ambulatory Visit (INDEPENDENT_AMBULATORY_CARE_PROVIDER_SITE_OTHER): Payer: Self-pay | Admitting: Radiology

## 2018-01-09 VITALS — BP 125/61 | HR 59 | Ht 66.0 in | Wt 144.0 lb

## 2018-01-09 DIAGNOSIS — M25512 Pain in left shoulder: Principal | ICD-10-CM

## 2018-01-09 DIAGNOSIS — G8929 Other chronic pain: Secondary | ICD-10-CM

## 2018-01-09 DIAGNOSIS — M25511 Pain in right shoulder: Secondary | ICD-10-CM | POA: Diagnosis not present

## 2018-01-09 NOTE — Progress Notes (Signed)
Office Visit Note   Patient: Ruth Murray           Date of Birth: Jun 26, 1940           MRN: [id number] Visit Date: 01/09/2018              Requested by: Celene Squibb, MD [place], [place] [address] PCP: Celene Squibb, MD   Assessment & Plan: Visit Diagnoses:  1. Chronic pain of both shoulders     Plan: MRI scan of right shoulder reveals rotator cuff tendinopathy worse in the supraspinatus.  No tear identified.  Normal muscle appearance without atrophy.  Long head biceps tendinosis.  Moderately severe degenerative change at the acromioclavicular joint with mass-effect on the supraspinatus belly.  Superior labrum is severely degenerative.  No fracture or worrisome lesion.  Having some symptoms on the left side as well but to a lesser extent.  Pain has been chronic.  Long discussion regarding different treatment options including arthroscopic debridement versus physical therapy.  Ruth Murray would like to try physical therapy.  We will order this at [facility] and plan to see her back over the next 4 to 6 weeks.  We can always defer surgery.  I have discussed that with her as well.  This would include an arthroscopic subacromial decompression, distal clavicle resection and possibly release of the biceps tendon.  Follow-Up Instructions: Return in about 1 month (around 02/09/2018).   Orders:  No orders of the defined types were placed in this encounter.  No orders of the defined types were placed in this encounter.     Procedures: No procedures performed   Clinical Data: No additional findings.   Subjective: Chief Complaint  Patient presents with  . Follow-up    MRI F/U R SHOULDER  Has been having trouble with her right shoulder over period of several months as outlined.  Ruth Murray is active playing pickle ball and tennis.  Having some symptoms referable to the left shoulder.  HPI  Review of Systems  Constitutional: Negative for fatigue and fever.  HENT:  Negative for ear pain.   Eyes: Negative for pain.  Respiratory: Negative for cough and shortness of breath.   Cardiovascular: Negative for leg swelling.  Gastrointestinal: Negative for constipation and diarrhea.  Genitourinary: Negative for difficulty urinating.  Musculoskeletal: Positive for back pain and neck pain.  Skin: Negative for rash.  Allergic/Immunologic: Negative for food allergies.  Neurological: Positive for weakness. Negative for numbness.  Hematological: Does not bruise/bleed easily.  Psychiatric/Behavioral: Positive for sleep disturbance.     Objective: Vital Signs: BP 125/61 (BP Location: Right Arm, Patient Position: Sitting, Cuff Size: Normal)   Pulse (!) 59   Ht 5\' 6"  (1.676 m)   Wt 144 lb (65.3 kg)   BMI 23.24 kg/m   Physical Exam  Constitutional: She is oriented to person, place, and time. She appears well-developed and well-nourished.  HENT:  Mouth/Throat: Oropharynx is clear and moist.  Eyes: Pupils are equal, round, and reactive to light. EOM are normal.  Pulmonary/Chest: Effort normal.  Neurological: She is alert and oriented to person, place, and time.  Skin: Skin is warm and dry.  Psychiatric: She has a normal mood and affect. Her behavior is normal.    Ortho Exam awake alert and oriented x3.  Comfortable sitting.  Positive impingement and empty can testing right shoulder.  Mildly positive speeds side.  Some tenderness about the acromioclavicular joint right shoulder with hypertrophic  changes.  Some tenderness over the anterior subacromial region.  Good strength.  Skin intact.  No pain referable to the cervical spine.  Specialty Comments:  No specialty comments available.  Imaging: No results found.   PMFS History: Patient Active Problem List   Diagnosis Date Noted  . Chronic pain of both shoulders 01/09/2018   Past Medical History:  Diagnosis Date  . Arthritis   . Hyperlipidemia   . Hypertension     Family History  Problem Relation  Age of Onset  . Colon cancer Neg Hx   . Stomach cancer Neg Hx   . Esophageal cancer Neg Hx   . Rectal cancer Neg Hx     Past Surgical History:  Procedure Laterality Date  . [place]   right  . GANGLION CYST EXCISION  2010   wrist  . TONSILLECTOMY  1965   Social History   Occupational History  . Not on file  Tobacco Use  . Smoking status: Former Smoker    Last attempt to quit: 01/26/1972    Years since quitting: 45.9  . Smokeless tobacco: Never Used  Substance and Sexual Activity  . Alcohol use: Yes    Alcohol/week: 1.8 oz    Types: 3 Standard drinks or equivalent per week  . Drug use: Not Currently  . Sexual activity: Not on file

## 2018-02-18 ENCOUNTER — Ambulatory Visit (HOSPITAL_COMMUNITY): Payer: Medicare Other | Attending: Orthopaedic Surgery

## 2018-02-18 ENCOUNTER — Other Ambulatory Visit: Payer: Self-pay

## 2018-02-18 ENCOUNTER — Encounter (HOSPITAL_COMMUNITY): Payer: Self-pay

## 2018-02-18 DIAGNOSIS — M25511 Pain in right shoulder: Secondary | ICD-10-CM | POA: Diagnosis not present

## 2018-02-18 DIAGNOSIS — G8929 Other chronic pain: Secondary | ICD-10-CM | POA: Diagnosis present

## 2018-02-18 DIAGNOSIS — R293 Abnormal posture: Secondary | ICD-10-CM | POA: Diagnosis present

## 2018-02-18 DIAGNOSIS — R29898 Other symptoms and signs involving the musculoskeletal system: Secondary | ICD-10-CM | POA: Diagnosis present

## 2018-02-18 DIAGNOSIS — M25512 Pain in left shoulder: Secondary | ICD-10-CM | POA: Insufficient documentation

## 2018-02-18 NOTE — Patient Instructions (Signed)
Access Code: [email or web address]  URL: [email or web address]  Date: 02/18/2018  Prepared by: Geraldine Solar   Exercises Standing Shoulder Posterior Capsule Stretch - 10 reps - 3 sets - 1x daily - 7x weekly Doorway Pec Stretch at 90 Degrees Abduction - 10 reps - 3 sets - 1x daily - 7x weekly

## 2018-02-18 NOTE — Therapy (Signed)
Ruth Murray, [place], [address] Phone: [phone]   Fax:  [phone]  Physical Therapy Evaluation  Patient Details  Name: Ruth Murray MRN: [id number] Date of Birth: 05-25-1941 Referring Provider: Joni Fears, MD   Encounter Date: 02/18/2018  PT End of Session - 02/18/18 0941    Visit Number  1    Number of Visits  9    Date for PT Re-Evaluation  03/18/18    Authorization Type  [employer] Medicare    Authorization Time Period  02/18/18 to 03/18/18    Authorization - Visit Number  1    Authorization - Number of Visits  10    PT Start Time  0900    PT Stop Time  0936    PT Time Calculation (min)  36 min    Activity Tolerance  Patient tolerated treatment well;No increased pain    Behavior During Therapy  WFL for tasks assessed/performed       Past Medical History:  Diagnosis Date  . Arthritis   . Hyperlipidemia   . Hypertension     Past Surgical History:  Procedure Laterality Date  . [place]   right  . GANGLION CYST EXCISION  2010   wrist  . TONSILLECTOMY  1965    There were no vitals filed for this visit.   Subjective Assessment - 02/18/18 0902    Subjective  Pt reports that Dr. Durward Fortes told her she has arthritis in her shoulders, R worse than L. She states she's been having this pain for several months of insidious onset. She is still able to do whatever she wants, she just has pain with it. She denies any trauma or falls. She is R handed. It does not affect her driving but when sleeps on her R side it will bother her. She reports that her pain is intermittent and is primarily located at her anterior shoulder and goes down into her elbow. Using it or moving it certain ways aggravate her pain, and rest and Aleve help relieve her pain.    Limitations  House hold activities;Lifting    How long can you sit comfortably?  no issues    How long can you stand comfortably?  no issues     How long can you walk comfortably?  no issues    Diagnostic tests  MRI showing RTC and long head biceps tendinopathy without tear    Patient Stated Goals  pain free, or tone it down a little    Currently in Pain?  No/denies         [facility] PT Assessment - 02/18/18 0001      Assessment   Medical Diagnosis  bil shoulder pain    Referring Provider  Joni Fears, MD    Onset Date/Surgical Date  --   few months ago   Hand Dominance  Right    Next MD Visit  no f/u scheduled      Balance Screen   Has the patient fallen in the past 6 months  No    Has the patient had a decrease in activity level because of a fear of falling?   No    Is the patient reluctant to leave their home because of a fear of falling?   No      Prior Function   Level of Independence  Independent    Vocation  Retired;Part time employment    Vocation Requirements  works  at [employer] [place]  pickle ball, tennis, walking      Observation/Other Assessments   Focus on Therapeutic Outcomes (FOTO)   34% limitation      ROM / Strength   AROM / PROM / Strength  AROM;Strength      AROM   AROM Assessment Site  Shoulder    Right Shoulder Flexion  148 Degrees    Right Shoulder ABduction  152 Degrees    Left Shoulder Flexion  154 Degrees    Left Shoulder ABduction  150 Degrees      Strength   Strength Assessment Site  Shoulder;Elbow;Wrist;Hand    Right Shoulder Flexion  4+/5    Right Shoulder ABduction  4+/5    Right Shoulder Internal Rotation  5/5    Right Shoulder External Rotation  4+/5    Left Shoulder Flexion  4+/5    Left Shoulder ABduction  4/5    Left Shoulder Internal Rotation  5/5    Left Shoulder External Rotation  4+/5    Right Elbow Flexion  4+/5    Right Elbow Extension  4+/5    Left Elbow Flexion  5/5    Left Elbow Extension  5/5    Right Wrist Flexion  5/5    Right Wrist Extension  5/5    Left Wrist Flexion  5/5    Left Wrist Extension  5/5    Right  Hand Gross Grasp  Functional    Left Hand Gross Grasp  Functional      Palpation   Spinal mobility  hypomobile cervical and thoracic spine per gross assessment    Palpation comment  mod-max restrictions throughout bil RTC musculature, biceps, upper trap, R >L; palpation throughout recreated her same pain      Special Tests    Special Tests  Rotator Cuff Impingement    Rotator Cuff Impingment tests  Painful Arc of Motion;Hawkins- Kennedy test;Drop Arm test;Empty Can test;Full Can test;Belly Press;Lift- off test      Hawkins-Kennedy test   Findings  Positive    Side  Right    Comments  negative Left      Lift-Off test   Findings  Negative    Side  Right    Comment  and Lt      Belly Press   Findings  Positive    Side  Right    Comments  mild pain, neg on L      Empty Can test   Findings  Positive    Side  Right    Comment  worse than full can on R; mild pain on L      Full Can test   Findings  Positive    Side  Right    Comment  neg Left      Drop Arm test   Findings  Negative    Side  Right      Painful Arc of Motion   Findings  Positive    Side  Right    Comments  L mildly positive as she had pain at very end range            Objective measurements completed on examination: See above findings.         PT Education - 02/18/18 0941    Education Details  exam findings, POC, HEP    Person(s) Educated  Patient    Methods  Explanation;Demonstration;Handout    Comprehension  Verbalized understanding  PT Short Term Goals - 02/18/18 1207      PT SHORT TERM GOAL #1   Title  Pt will be independent with HEP and perform consistently in order to decrease shoulder pain.    Time  2    Period  Weeks    Status  New    Target Date  03/04/18      PT SHORT TERM GOAL #2   Title  Pt will have improved BUE flexion and abd AROM to 160deg with minimal to no pain at end range in order to maximize OH activities at home.    Time  2    Period  Weeks    Status   New        PT Long Term Goals - 02/18/18 1207      PT LONG TERM GOAL #1   Title  Pt will report being able to sleep on her R side without awakening due to pain in order to demo improved soft tissue restrictions and maximize her overall recovery.    Time  4    Period  Weeks    Status  New    Target Date  03/18/18      PT LONG TERM GOAL #2   Title  Pt will have decreased soft tissue restrictions throughout RTC musculature and biceps to mild in order to decrease her pain and maximize her ROM.     Time  4    Period  Weeks    Status  New      PT LONG TERM GOAL #3   Title  Pt will have improved FOTO to 20% limited or better to demo improved ability to perform functional tasks at home with less shoulder pain.     Time  4    Period  Weeks    Status  New             Plan - 02/18/18 0946    Clinical Impression Statement  Pt is 77YO F who presents to OPPT with c/o bil shoulder pain, R>L. Per her most recent MRI, it showed RTC and long head biceps tendinopathy without tear. She currently has mild deficits in AROM, MMT, functional AROM (hand behind back, hand behind head), and pain with functional tasks, mainly OH, and deficits in posture and thoracic mobility. Pt positive for RTC special testing throughout RUE and mostly negative on LUE. Pt also noted to have palpable taut bands throughout RTC and biceps mm bellies and tendinous insertions which she reported all recreated her pain. Pt needs skilled PT intervention to address these impairments in order to decrease pain and maximize overall function.    Clinical Presentation  Stable    Clinical Presentation due to:  AROM, hand behind head, hand behind back, posture, spinal mobility, +special tests on R, mostly -special testing on L, MMT    Clinical Decision Making  Low    Rehab Potential  Good    PT Frequency  2x / week    PT Duration  4 weeks    PT Treatment/Interventions  ADLs/Self Care Home Management;Aquatic  Therapy;Cryotherapy;Electrical Stimulation;Moist Heat;Ultrasound;Functional mobility training;Therapeutic activities;Therapeutic exercise;Neuromuscular re-education;Patient/family education;Manual techniques;Passive range of motion;Dry needling;Taping;Spinal Manipulations;Joint Manipulations    PT Next Visit Plan  review goals and HEP; begin postural education and strengthening, isolated RTC strengthening, biceps strengthening, manual for soft tissue restrictions and joint mobility, thoracic mobility    PT Home Exercise Plan  eval: posterior capsule stretch, pec stretch in doorway    Consulted  and Agree with Plan of Care  Patient       Patient will benefit from skilled therapeutic intervention in order to improve the following deficits and impairments:  Decreased range of motion, Decreased strength, Hypomobility, Increased fascial restricitons, Increased muscle spasms, Impaired flexibility, Impaired UE functional use, Postural dysfunction, Pain  Visit Diagnosis: Chronic right shoulder pain - Plan: PT plan of care cert/re-cert  Abnormal posture - Plan: PT plan of care cert/re-cert  Other symptoms and signs involving the musculoskeletal system - Plan: PT plan of care cert/re-cert  Chronic left shoulder pain - Plan: PT plan of care cert/re-cert     Problem List Patient Active Problem List   Diagnosis Date Noted  . Chronic pain of both shoulders 01/09/2018       Geraldine Solar PT, DPT  [facility] [address] [place], [place], [address] Phone: [phone]   Fax:  [phone]  Name: Ruth Murray MRN: [id number] Date of Birth: July 16, 1940

## 2018-02-20 ENCOUNTER — Ambulatory Visit (HOSPITAL_COMMUNITY): Payer: Medicare Other

## 2018-02-20 ENCOUNTER — Encounter (HOSPITAL_COMMUNITY): Payer: Self-pay

## 2018-02-20 DIAGNOSIS — M25511 Pain in right shoulder: Principal | ICD-10-CM

## 2018-02-20 DIAGNOSIS — G8929 Other chronic pain: Secondary | ICD-10-CM

## 2018-02-20 DIAGNOSIS — M25512 Pain in left shoulder: Secondary | ICD-10-CM

## 2018-02-20 DIAGNOSIS — R29898 Other symptoms and signs involving the musculoskeletal system: Secondary | ICD-10-CM

## 2018-02-20 DIAGNOSIS — R293 Abnormal posture: Secondary | ICD-10-CM

## 2018-02-20 NOTE — Patient Instructions (Signed)
Shoulder External Rotators    With left elbow bent 90 and held at side, move hand away from body, keeping elbow at side. Use tubing or _1___ pound weight. Keep head and back straight. Hold _2-3___ seconds. Repeat _10___ times. Do _1___ sessions per day. CAUTION: Move slowly.  Copyright  VHI. All rights reserved.

## 2018-02-20 NOTE — Therapy (Signed)
[place], [place], [address] Phone: [phone]   Fax:  [phone]  Physical Therapy Treatment  Patient Details  Name: Ruth Murray MRN: [id number] Date of Birth: 1940/11/30 Referring Provider: Joni Fears, MD   Encounter Date: 02/20/2018  PT End of Session - 02/20/18 0902    Visit Number  2    Number of Visits  9    Date for PT Re-Evaluation  03/18/18    Authorization Type  [employer] Medicare    Authorization Time Period  02/18/18 to 03/18/18    Authorization - Visit Number  2    Authorization - Number of Visits  10    PT Start Time  0903    PT Stop Time  0950    PT Time Calculation (min)  47 min    Activity Tolerance  Patient tolerated treatment well;No increased pain    Behavior During Therapy  WFL for tasks assessed/performed       Past Medical History:  Diagnosis Date  . Arthritis   . Hyperlipidemia   . Hypertension     Past Surgical History:  Procedure Laterality Date  . [place]   right  . GANGLION CYST EXCISION  2010   wrist  . TONSILLECTOMY  1965    There were no vitals filed for this visit.  Subjective Assessment - 02/20/18 0912    Subjective  Pain actually a little better today. Has been performing HEP. 5/10 in right shoulder worst at this time. L shoulder hurts but not as bad as right. Plays tennis and golfs.    Limitations  House hold activities;Lifting    How long can you sit comfortably?  no issues    How long can you stand comfortably?  no issues    How long can you walk comfortably?  no issues    Diagnostic tests  MRI showing RTC and long head biceps tendinopathy without tear    Patient Stated Goals  pain free, or tone it down a little                       [facility] Adult PT Treatment/Exercise - 02/20/18 0001      Posture/Postural Control   Posture/Postural Control  Postural limitations    Postural Limitations  Rounded Shoulders;Forward  head;Increased thoracic kyphosis;Left pelvic obliquity    Posture Comments  Left iliac crest significantly higher than right in standing      Shoulder Exercises: Sidelying   External Rotation  Strengthening;Right;10 reps    External Rotation Weight (lbs)  1 lb    External Rotation Limitations  scap retraction 1st; 3 sets      Shoulder Exercises: Standing   External Rotation  Strengthening;Both;12 reps    Theraband Level (Shoulder External Rotation)  Level 2 (Red)    External Rotation Weight (lbs)  2 sets each      Shoulder Exercises: Stretch   Corner Stretch  5 reps;10 seconds    Corner Stretch Limitations  doorway    Other Shoulder Stretches  side-lying clock for T spine rotation and shldr motion; 3 times clock/counterclock on Lt and Rt    Other Shoulder Stretches  posterior capsule stretch thru shldr horizontal adduction 10 sec; 5 reps bilat      Manual Therapy   Manual Therapy  Joint mobilization;Soft tissue mobilization;Myofascial release    Joint Mobilization  glenohumeral jt AP, PA, clockwise, counterclockwise, superior/inferior  Soft tissue mobilization  periscapular mm; infraspinatus, scubscap, supra    Myofascial Release  Rt UE for flexion/abduction/ER/IR             PT Education - 02/20/18 0959    Education Details  Reviewed eval/goals and HEP, advanced HEP, educated on purpose and technique of exercises throughout session.    Person(s) Educated  Patient    Methods  Explanation;Demonstration;Handout    Comprehension  Verbalized understanding       PT Short Term Goals - 02/18/18 1207      PT SHORT TERM GOAL #1   Title  Pt will be independent with HEP and perform consistently in order to decrease shoulder pain.    Time  2    Period  Weeks    Status  New    Target Date  03/04/18      PT SHORT TERM GOAL #2   Title  Pt will have improved BUE flexion and abd AROM to 160deg with minimal to no pain at end range in order to maximize OH activities at home.     Time  2    Period  Weeks    Status  New        PT Long Term Goals - 02/18/18 1207      PT LONG TERM GOAL #1   Title  Pt will report being able to sleep on her R side without awakening due to pain in order to demo improved soft tissue restrictions and maximize her overall recovery.    Time  4    Period  Weeks    Status  New    Target Date  03/18/18      PT LONG TERM GOAL #2   Title  Pt will have decreased soft tissue restrictions throughout RTC musculature and biceps to mild in order to decrease her pain and maximize her ROM.     Time  4    Period  Weeks    Status  New      PT LONG TERM GOAL #3   Title  Pt will have improved FOTO to 20% limited or better to demo improved ability to perform functional tasks at home with less shoulder pain.     Time  4    Period  Weeks    Status  New            Plan - 02/20/18 4431    Clinical Impression Statement  Patient tolerated treatment well. Stated she plays tennis and golf although these sports do not feel particularly restricted at this point. Patient was able to replicate HEP accurately. Patient performed scapular retraction will minimal education. Performed exercises in session without c/o pain although crepitus was noted in bilateral biceps tendons with red theraband shoulder external rotation. Patient does exhibit right-handedness patterning of musculature and left iliac crest is significantly higher than right. Scoliotic curve was not assessed today. Patient would continue to benefit from skilled OPPT to further address deficts, reduce pain and return her to PLOF.    Rehab Potential  Good    PT Frequency  2x / week    PT Duration  4 weeks    PT Treatment/Interventions  ADLs/Self Care Home Management;Aquatic Therapy;Cryotherapy;Electrical Stimulation;Moist Heat;Ultrasound;Functional mobility training;Therapeutic activities;Therapeutic exercise;Neuromuscular re-education;Patient/family education;Manual techniques;Passive range of  motion;Dry needling;Taping;Spinal Manipulations;Joint Manipulations    PT Next Visit Plan  review HEP; conitnue postural education and strengthening, isolated RTC strengthening, biceps strengthening, manual for soft tissue restrictions and joint mobility, thoracic mobility  PT Home Exercise Plan  eval: posterior capsule stretch, pec stretch in doorway; 02/20/2018 - side-lying shoulder ER with 1# weight    Consulted and Agree with Plan of Care  Patient       Patient will benefit from skilled therapeutic intervention in order to improve the following deficits and impairments:  Decreased range of motion, Decreased strength, Hypomobility, Increased fascial restricitons, Increased muscle spasms, Impaired flexibility, Impaired UE functional use, Postural dysfunction, Pain  Visit Diagnosis: Chronic right shoulder pain  Abnormal posture  Other symptoms and signs involving the musculoskeletal system  Chronic left shoulder pain     Problem List Patient Active Problem List   Diagnosis Date Noted  . Chronic pain of both shoulders 01/09/2018    Floria Raveling. Hartnett-Rands, MS, PT Per [facility] #[id number] 02/20/2018, 10:06 AM  [facility] [address] [place], [place], [address] Phone: [phone]   Fax:  [phone]  Name: Ruth Murray MRN: [id number] Date of Birth: 06-22-1940

## 2018-02-27 ENCOUNTER — Encounter (HOSPITAL_COMMUNITY): Payer: Self-pay

## 2018-02-27 ENCOUNTER — Ambulatory Visit (HOSPITAL_COMMUNITY): Payer: Medicare Other

## 2018-02-27 DIAGNOSIS — R29898 Other symptoms and signs involving the musculoskeletal system: Secondary | ICD-10-CM

## 2018-02-27 DIAGNOSIS — G8929 Other chronic pain: Secondary | ICD-10-CM

## 2018-02-27 DIAGNOSIS — M25511 Pain in right shoulder: Secondary | ICD-10-CM | POA: Diagnosis not present

## 2018-02-27 DIAGNOSIS — M25512 Pain in left shoulder: Secondary | ICD-10-CM

## 2018-02-27 DIAGNOSIS — R293 Abnormal posture: Secondary | ICD-10-CM

## 2018-02-27 NOTE — Therapy (Signed)
[facility] [place], [place], [address] Phone: [phone]   Fax:  [phone]  Physical Therapy Treatment  Patient Details  Name: Ruth Murray MRN: [id number] Date of Birth: 07-12-1940 Referring Provider: Joni Fears, MD   Encounter Date: 02/27/2018  PT End of Session - 02/27/18 1120    Visit Number  3    Number of Visits  9    Date for PT Re-Evaluation  03/18/18    Authorization Type  [employer] Medicare    Authorization Time Period  02/18/18 to 03/18/18    Authorization - Visit Number  3    Authorization - Number of Visits  10    PT Start Time  1119    PT Stop Time  1202    PT Time Calculation (min)  43 min    Activity Tolerance  Patient tolerated treatment well;No increased pain    Behavior During Therapy  WFL for tasks assessed/performed       Past Medical History:  Diagnosis Date  . Arthritis   . Hyperlipidemia   . Hypertension     Past Surgical History:  Procedure Laterality Date  . [place]   right  . GANGLION CYST EXCISION  2010   wrist  . TONSILLECTOMY  1965    There were no vitals filed for this visit.  Subjective Assessment - 02/27/18 1120    Subjective  Pt states that her R shoulder is still bothering her more so than her L, however, she does feel that therapy has helped some so far. No pain until she goes to use it    Limitations  House hold activities;Lifting    How long can you sit comfortably?  no issues    How long can you stand comfortably?  no issues    How long can you walk comfortably?  no issues    Diagnostic tests  MRI showing RTC and long head biceps tendinopathy without tear    Patient Stated Goals  pain free, or tone it down a little    Currently in Pain?  No/denies            [facility] Adult PT Treatment/Exercise - 02/27/18 0001      Exercises   Exercises  Shoulder      Shoulder Exercises: Supine   Other Supine Exercises  bil serratus punching 2#  3x10reps      Shoulder Exercises: Seated   Other Seated Exercises  retro stool roll backs 10x10" holds    Other Seated Exercises  3D thoracic excursions x10 reps each      Shoulder Exercises: Sidelying   External Rotation  Right;10 reps    External Rotation Weight (lbs)  2    External Rotation Limitations  2 sets    ABduction  AROM;Right;10 reps    ABduction Limitations  R scapula noted to wing; slightly decreased pain when scapula stabilized prior to abd      Shoulder Exercises: Standing   External Rotation  Both;10 reps    Theraband Level (Shoulder External Rotation)  Level 2 (Red)    External Rotation Weight (lbs)  2 sets each, towel roll    Extension  Both;10 reps;Theraband    Theraband Level (Shoulder Extension)  Level 2 (Red)    Extension Weight (lbs)  2 sets    Row  Both;10 reps;Theraband    Theraband Level (Shoulder Row)  Level 2 (Red)    Row Weight (lbs)  2  sets    Other Standing Exercises  money RTB 2x10      Shoulder Exercises: ROM/Strengthening   UBE (Upper Arm Bike)  x3 mins, L1, retro for postural strengthening      Shoulder Exercises: Stretch   Corner Stretch  3 reps;30 seconds    Corner Stretch Limitations  doorway, hands low      Manual Therapy   Manual Therapy  Joint mobilization    Manual therapy comments  completed separate rest of treatment    Joint Mobilization  RUE: ER joint mobs in 20*, 45*, and 90* abd, end range anterior joint mobs for end range flexion, posterior and inferior GH joint mobs for flexion and abd ROM             PT Education - 02/27/18 1122    Education Details  exercise technique, contiue HEP    Person(s) Educated  Patient    Methods  Explanation;Demonstration    Comprehension  Verbalized understanding;Returned demonstration       PT Short Term Goals - 02/18/18 1207      PT SHORT TERM GOAL #1   Title  Pt will be independent with HEP and perform consistently in order to decrease shoulder pain.    Time  2    Period   Weeks    Status  New    Target Date  03/04/18      PT SHORT TERM GOAL #2   Title  Pt will have improved BUE flexion and abd AROM to 160deg with minimal to no pain at end range in order to maximize OH activities at home.    Time  2    Period  Weeks    Status  New        PT Long Term Goals - 02/18/18 1207      PT LONG TERM GOAL #1   Title  Pt will report being able to sleep on her R side without awakening due to pain in order to demo improved soft tissue restrictions and maximize her overall recovery.    Time  4    Period  Weeks    Status  New    Target Date  03/18/18      PT LONG TERM GOAL #2   Title  Pt will have decreased soft tissue restrictions throughout RTC musculature and biceps to mild in order to decrease her pain and maximize her ROM.     Time  4    Period  Weeks    Status  New      PT LONG TERM GOAL #3   Title  Pt will have improved FOTO to 20% limited or better to demo improved ability to perform functional tasks at home with less shoulder pain.     Time  4    Period  Weeks    Status  New            Plan - 02/27/18 1205    Clinical Impression Statement  Pt reporting steady progress with therapy thus far. Continued with isolated RTC strengthening, especially ER, as well as postural and periscapular strengthening. R scapula noted to wing during sidelying abd so introduced serratus strengthening. Ended with [place] joint mobs for end range flexion and general flexion and abd ROM. Pt reporting that her arm felt better at EOS with less pain at end range.    Rehab Potential  Good    PT Frequency  2x / week    PT Duration  4 weeks    PT Treatment/Interventions  ADLs/Self Care Home Management;Aquatic Therapy;Cryotherapy;Electrical Stimulation;Moist Heat;Ultrasound;Functional mobility training;Therapeutic activities;Therapeutic exercise;Neuromuscular re-education;Patient/family education;Manual techniques;Passive range of motion;Dry needling;Taping;Spinal  Manipulations;Joint Manipulations    PT Next Visit Plan  update HEP to include 3D thoracic excursions; conitnue postural education and strengthening, isolated RTC strengthening, biceps strengthening, manual for soft tissue restrictions and joint mobility, thoracic mobility    PT Home Exercise Plan  eval: posterior capsule stretch, pec stretch in doorway; 02/20/2018 - side-lying shoulder ER with 1# weight    Consulted and Agree with Plan of Care  Patient       Patient will benefit from skilled therapeutic intervention in order to improve the following deficits and impairments:  Decreased range of motion, Decreased strength, Hypomobility, Increased fascial restricitons, Increased muscle spasms, Impaired flexibility, Impaired UE functional use, Postural dysfunction, Pain  Visit Diagnosis: Chronic right shoulder pain  Abnormal posture  Other symptoms and signs involving the musculoskeletal system  Chronic left shoulder pain     Problem List Patient Active Problem List   Diagnosis Date Noted  . Chronic pain of both shoulders 01/09/2018        Geraldine Solar PT, DPT  [facility] [address] [place], [place], [address] Phone: [phone]   Fax:  [phone]  Name: Ruth Murray MRN: [id number] Date of Birth: 1940/07/30

## 2018-03-01 ENCOUNTER — Encounter (HOSPITAL_COMMUNITY): Payer: Self-pay

## 2018-03-01 ENCOUNTER — Ambulatory Visit (HOSPITAL_COMMUNITY): Payer: Medicare Other

## 2018-03-01 DIAGNOSIS — M25512 Pain in left shoulder: Secondary | ICD-10-CM

## 2018-03-01 DIAGNOSIS — M25511 Pain in right shoulder: Principal | ICD-10-CM

## 2018-03-01 DIAGNOSIS — R29898 Other symptoms and signs involving the musculoskeletal system: Secondary | ICD-10-CM

## 2018-03-01 DIAGNOSIS — G8929 Other chronic pain: Secondary | ICD-10-CM

## 2018-03-01 DIAGNOSIS — R293 Abnormal posture: Secondary | ICD-10-CM

## 2018-03-01 NOTE — Therapy (Signed)
[place], [place], [address] Phone: [phone]   Fax:  [phone]  Physical Therapy Treatment  Patient Details  Name: Ruth Murray MRN: [id number] Date of Birth: 1941-05-21 Referring Provider (PT): Joni Fears, MD   Encounter Date: 03/01/2018  PT End of Session - 03/01/18 0819    Visit Number  4    Number of Visits  9    Date for PT Re-Evaluation  03/18/18    Authorization Type  [employer] Medicare    Authorization Time Period  02/18/18 to 03/18/18    Authorization - Visit Number  4    Authorization - Number of Visits  10    PT Start Time  0817    PT Stop Time  0901    PT Time Calculation (min)  44 min    Activity Tolerance  Patient tolerated treatment well;No increased pain    Behavior During Therapy  WFL for tasks assessed/performed       Past Medical History:  Diagnosis Date  . Arthritis   . Hyperlipidemia   . Hypertension     Past Surgical History:  Procedure Laterality Date  . [place]   right  . GANGLION CYST EXCISION  2010   wrist  . TONSILLECTOMY  1965    There were no vitals filed for this visit.  Subjective Assessment - 03/01/18 0819    Subjective  Pt states that she is feeling good this morning. No pain at the moment, though she hasn't been using it a whole lot this morning.     Limitations  House hold activities;Lifting    How long can you sit comfortably?  no issues    How long can you stand comfortably?  no issues    How long can you walk comfortably?  no issues    Diagnostic tests  MRI showing RTC and long head biceps tendinopathy without tear    Patient Stated Goals  pain free, or tone it down a little    Currently in Pain?  No/denies            [facility] Adult PT Treatment/Exercise - 03/01/18 0001      Exercises   Exercises  Shoulder      Shoulder Exercises: Seated   Other Seated Exercises  3D thoracic excursions x10 reps each      Shoulder  Exercises: Sidelying   Other Sidelying Exercises  sidelying thoracic rotation (hips 90/90) 5x10" each      Shoulder Exercises: Standing   Protraction Limitations  bear hugs RTB x20 reps    External Rotation  Both;15 reps    Theraband Level (Shoulder External Rotation)  Level 2 (Red)    External Rotation Weight (lbs)  2 sets each, towel roll    Other Standing Exercises  bil GH ER isometrics 10x10" holds each      Shoulder Exercises: ROM/Strengthening   UBE (Upper Arm Bike)  x3 mins, L2, retro for postural strengthening      Shoulder Exercises: Stretch   Internal Rotation Stretch  10 seconds    Internal Rotation Stretch Limitations  BUE, seated with towel      Manual Therapy   Manual Therapy  Joint mobilization;Myofascial release    Manual therapy comments  completed separate rest of treatment    Joint Mobilization  RUE: ER joint mobs in 20*, 45*, and 90* abd, posterior and inferior GH joint mobs for flexion and abd ROM  Myofascial Release  R RTC insertion and middle deltoid           PT Education - 03/01/18 0819    Education Details  exercise technique, updated HEP    Person(s) Educated  Patient    Methods  Explanation;Demonstration;Handout    Comprehension  Verbalized understanding;Returned demonstration       PT Short Term Goals - 02/18/18 1207      PT SHORT TERM GOAL #1   Title  Pt will be independent with HEP and perform consistently in order to decrease shoulder pain.    Time  2    Period  Weeks    Status  New    Target Date  03/04/18      PT SHORT TERM GOAL #2   Title  Pt will have improved BUE flexion and abd AROM to 160deg with minimal to no pain at end range in order to maximize OH activities at home.    Time  2    Period  Weeks    Status  New        PT Long Term Goals - 02/18/18 1207      PT LONG TERM GOAL #1   Title  Pt will report being able to sleep on her R side without awakening due to pain in order to demo improved soft tissue restrictions  and maximize her overall recovery.    Time  4    Period  Weeks    Status  New    Target Date  03/18/18      PT LONG TERM GOAL #2   Title  Pt will have decreased soft tissue restrictions throughout RTC musculature and biceps to mild in order to decrease her pain and maximize her ROM.     Time  4    Period  Weeks    Status  New      PT LONG TERM GOAL #3   Title  Pt will have improved FOTO to 20% limited or better to demo improved ability to perform functional tasks at home with less shoulder pain.     Time  4    Period  Weeks    Status  New            Plan - 03/01/18 0904    Clinical Impression Statement  Pt overall continues to report steady progress with therapy, though is still having R shoulder pain with activity. Continued with isolated RTC strengthening and added [facility] ER isometrics for pain control. Continued with joint mobs and resumed MFR to RTC insertion/middle delt and pt reporting recreation of pain with palpation in this area. May potentially initiate trigger point dry needling within the next few sessions, pending pt's response to MFR. Updated HEP for thoracic mobility. Continue as planned, progressing as able.     Rehab Potential  Good    PT Frequency  2x / week    PT Duration  4 weeks    PT Treatment/Interventions  ADLs/Self Care Home Management;Aquatic Therapy;Cryotherapy;Electrical Stimulation;Moist Heat;Ultrasound;Functional mobility training;Therapeutic activities;Therapeutic exercise;Neuromuscular re-education;Patient/family education;Manual techniques;Passive range of motion;Dry needling;Taping;Spinal Manipulations;Joint Manipulations    PT Next Visit Plan  conitnue postural education and strengthening, isolated RTC strengthening, biceps strengthening, manual for soft tissue restrictions and joint mobility, thoracic mobility; potentially intiate trigger point dry needling    PT Home Exercise Plan  eval: posterior capsule stretch, pec stretch in doorway; 02/20/2018 -  side-lying shoulder ER with 1# weight; 9/27: 3D thoracic excursions    Consulted and  Agree with Plan of Care  Patient       Patient will benefit from skilled therapeutic intervention in order to improve the following deficits and impairments:  Decreased range of motion, Decreased strength, Hypomobility, Increased fascial restricitons, Increased muscle spasms, Impaired flexibility, Impaired UE functional use, Postural dysfunction, Pain  Visit Diagnosis: Chronic right shoulder pain  Abnormal posture  Other symptoms and signs involving the musculoskeletal system  Chronic left shoulder pain     Problem List Patient Active Problem List   Diagnosis Date Noted  . Chronic pain of both shoulders 01/09/2018        Geraldine Solar PT, DPT  [place] [address] [place], [place], [address] Phone: [phone]   Fax:  [phone]  Name: Ruth Murray MRN: [id number] Date of Birth: 06/25/1940

## 2018-03-05 ENCOUNTER — Other Ambulatory Visit: Payer: Self-pay

## 2018-03-05 ENCOUNTER — Encounter (HOSPITAL_COMMUNITY): Payer: Self-pay

## 2018-03-05 ENCOUNTER — Ambulatory Visit (HOSPITAL_COMMUNITY): Payer: Medicare Other | Attending: Orthopaedic Surgery

## 2018-03-05 DIAGNOSIS — R293 Abnormal posture: Secondary | ICD-10-CM | POA: Diagnosis present

## 2018-03-05 DIAGNOSIS — M25511 Pain in right shoulder: Secondary | ICD-10-CM | POA: Diagnosis not present

## 2018-03-05 DIAGNOSIS — M25512 Pain in left shoulder: Secondary | ICD-10-CM | POA: Diagnosis present

## 2018-03-05 DIAGNOSIS — G8929 Other chronic pain: Secondary | ICD-10-CM | POA: Diagnosis present

## 2018-03-05 DIAGNOSIS — R29898 Other symptoms and signs involving the musculoskeletal system: Secondary | ICD-10-CM | POA: Diagnosis present

## 2018-03-05 NOTE — Therapy (Signed)
[place], [place], [address] Phone: [phone]   Fax:  [phone]  Physical Therapy Treatment  Patient Details  Name: Ruth Murray MRN: [id number] Date of Birth: 1940/07/21 Referring Provider (PT): Joni Fears, MD   Encounter Date: 03/05/2018  PT End of Session - 03/05/18 0925    Visit Number  5    Number of Visits  9    Date for PT Re-Evaluation  03/18/18    Authorization Type  [employer] Medicare    Authorization Time Period  02/18/18 to 03/18/18    Authorization - Visit Number  5    Authorization - Number of Visits  10    PT Start Time  7062    PT Stop Time  3762    PT Time Calculation (min)  42 min    Activity Tolerance  Patient tolerated treatment well;No increased pain    Behavior During Therapy  WFL for tasks assessed/performed       Past Medical History:  Diagnosis Date  . Arthritis   . Hyperlipidemia   . Hypertension     Past Surgical History:  Procedure Laterality Date  . [place]   right  . GANGLION CYST EXCISION  2010   wrist  . TONSILLECTOMY  1965    There were no vitals filed for this visit.  Subjective Assessment - 03/05/18 0914    Limitations  House hold activities;Lifting    How long can you sit comfortably?  no issues    How long can you stand comfortably?  no issues    How long can you walk comfortably?  no issues    Diagnostic tests  MRI showing RTC and long head biceps tendinopathy without tear    Patient Stated Goals  pain free, or tone it down a little    Currently in Pain?  No/denies       [facility] PT Assessment - 03/05/18 0001      Assessment   Medical Diagnosis  bil shoulder pain    Referring Provider (PT)  Joni Fears, MD      AROM   Right Shoulder Flexion  162 Degrees   was 148   Left Shoulder Flexion  3 Degrees   was 154       [facility] Adult PT Treatment/Exercise - 03/05/18 0001      Exercises   Exercises  Shoulder      Shoulder Exercises: Supine   Other Supine Exercises  bil serratus punching 2#, 2x 10reps      Shoulder Exercises: Seated   Other Seated Exercises  3D thoracic excursions x 5 reps each      Shoulder Exercises: Standing   Protraction Limitations  bear hugs RTB x20 reps    External Rotation  Both;15 reps    Theraband Level (Shoulder External Rotation)  Level 2 (Red)    External Rotation Weight (lbs)  2 sets each, towel roll    Extension  Both;15 reps;Theraband    Theraband Level (Shoulder Extension)  Level 2 (Red)    Extension Weight (lbs)  2 sets    Row  Strengthening;Both;15 reps;Theraband    Theraband Level (Shoulder Row)  Level 2 (Red)    Row Weight (lbs)  2 sets      Shoulder Exercises: ROM/Strengthening   UBE (Upper Arm Bike)  4 mins, L2, retro for postural strengthening      Shoulder Exercises: Stretch   Other Shoulder Stretches  10  reps bil, open book in sidelying for thoracic mobility stretch      Manual Therapy   Manual Therapy  Joint mobilization    Manual therapy comments  completed separate rest of treatment    Joint Mobilization  Rt glenohumeral joint: inferior glide, grade III, at 90 degrees        PT Education - 03/05/18 0925    Education Details  Educated on form with exercises throughout session and ROM stretchign for mobility of thoracic spine.     Person(s) Educated  Patient    Methods  Explanation;Verbal cues    Comprehension  Verbalized understanding;Returned demonstration       PT Short Term Goals - 03/05/18 0927      PT SHORT TERM GOAL #1   Title  Pt will be independent with HEP and perform consistently in order to decrease shoulder pain.    Time  2    Period  Weeks    Status  Achieved      PT SHORT TERM GOAL #2   Title  Pt will have improved BUE flexion and abd AROM to 160deg with minimal to no pain at end range in order to maximize OH activities at home.    Time  2    Period  Weeks    Status  Achieved        PT Long Term Goals - 03/05/18  3474      PT LONG TERM GOAL #1   Title  Pt will report being able to sleep on her R side without awakening due to pain in order to demo improved soft tissue restrictions and maximize her overall recovery.    Time  4    Period  Weeks    Status  On-going      PT LONG TERM GOAL #2   Title  Pt will have decreased soft tissue restrictions throughout RTC musculature and biceps to mild in order to decrease her pain and maximize her ROM.     Time  4    Period  Weeks    Status  On-going      PT LONG TERM GOAL #3   Title  Pt will have improved FOTO to 20% limited or better to demo improved ability to perform functional tasks at home with less shoulder pain.     Time  4    Period  Weeks    Status  On-going         Plan - 03/05/18 0926    Clinical Impression Statement  Continued with current POC today and focus on rotator cuff strengthening and mobility/ROM stretching and exercises. Patient continued with eccentric strengthening and thoracic mobility to improve posture for proper glenohumeral and scapular alignment. Ended session with inferior glide to Rt glenohumeral joint and patient had ~ 5 degree improvement in flexion following mobilization. She reported no pain at EOS. She will continue to benefit from skilled PT interventions to address impairments and improve QOL through mobility and reduced pain.    Rehab Potential  Good    PT Frequency  2x / week    PT Duration  4 weeks    PT Treatment/Interventions  ADLs/Self Care Home Management;Aquatic Therapy;Cryotherapy;Electrical Stimulation;Moist Heat;Ultrasound;Functional mobility training;Therapeutic activities;Therapeutic exercise;Neuromuscular re-education;Patient/family education;Manual techniques;Passive range of motion;Dry needling;Taping;Spinal Manipulations;Joint Manipulations    PT Next Visit Plan  Continue postural education and strengthening, isolated RTC strengthening, biceps strengthening. Perform manual for soft tissue restrictions  and joint mobility PRN. Include thoracic mobility for postural training  and potentially initiate trigger point dry needling. Add sleeper stretch for interal rotation stretch.    PT Home Exercise Plan  eval: posterior capsule stretch, pec stretch in doorway; 02/20/2018 - side-lying shoulder ER with 1# weight; 9/27: 3D thoracic excursions    Consulted and Agree with Plan of Care  Patient       Patient will benefit from skilled therapeutic intervention in order to improve the following deficits and impairments:  Decreased range of motion, Decreased strength, Hypomobility, Increased fascial restricitons, Increased muscle spasms, Impaired flexibility, Impaired UE functional use, Postural dysfunction, Pain  Visit Diagnosis: Chronic right shoulder pain  Abnormal posture  Other symptoms and signs involving the musculoskeletal system  Chronic left shoulder pain     Problem List Patient Active Problem List   Diagnosis Date Noted  . Chronic pain of both shoulders 01/09/2018    Kipp Brood, PT, DPT Physical Therapist with [facility]  03/05/2018 10:33 AM    [place] [address] [place], [place], [address] Phone: [phone]   Fax:  [phone]  Name: Ruth Murray MRN: [id number] Date of Birth: [birth year]

## 2018-03-07 ENCOUNTER — Ambulatory Visit (HOSPITAL_COMMUNITY): Payer: Medicare Other

## 2018-03-07 ENCOUNTER — Encounter (HOSPITAL_COMMUNITY): Payer: Self-pay

## 2018-03-07 DIAGNOSIS — R293 Abnormal posture: Secondary | ICD-10-CM

## 2018-03-07 DIAGNOSIS — M25511 Pain in right shoulder: Principal | ICD-10-CM

## 2018-03-07 DIAGNOSIS — G8929 Other chronic pain: Secondary | ICD-10-CM

## 2018-03-07 DIAGNOSIS — M25512 Pain in left shoulder: Secondary | ICD-10-CM

## 2018-03-07 DIAGNOSIS — R29898 Other symptoms and signs involving the musculoskeletal system: Secondary | ICD-10-CM

## 2018-03-07 NOTE — Therapy (Signed)
[place], [place], [address] Phone: [phone]   Fax:  [phone]  Physical Therapy Treatment  Patient Details  Name: Ruth Murray MRN: [id number] Date of Birth: [birth year] Referring Provider (PT): Joni Fears, MD   Encounter Date: 03/07/2018  PT End of Session - 03/07/18 1118    Visit Number  6    Number of Visits  9    Date for PT Re-Evaluation  03/18/18    Authorization Type  [employer] Medicare    Authorization Time Period  02/18/18 to 03/18/18    Authorization - Visit Number  6    Authorization - Number of Visits  10    PT Start Time  0737    PT Stop Time  1062    PT Time Calculation (min)  41 min    Activity Tolerance  Patient tolerated treatment well;No increased pain    Behavior During Therapy  WFL for tasks assessed/performed       Past Medical History:  Diagnosis Date  . Arthritis   . Hyperlipidemia   . Hypertension     Past Surgical History:  Procedure Laterality Date  . [place]   right  . GANGLION CYST EXCISION  2010   wrist  . TONSILLECTOMY  1965    There were no vitals filed for this visit.  Subjective Assessment - 03/07/18 1118    Subjective  Pt states that overall she is better. No pain currently but she hasn't done much with it yet.    Limitations  House hold activities;Lifting    How long can you sit comfortably?  no issues    How long can you stand comfortably?  no issues    How long can you walk comfortably?  no issues    Diagnostic tests  MRI showing RTC and long head biceps tendinopathy without tear    Patient Stated Goals  pain free, or tone it down a little    Currently in Pain?  No/denies            [facility] Adult PT Treatment/Exercise - 03/07/18 0001      Exercises   Exercises  Shoulder      Shoulder Exercises: Seated   Other Seated Exercises  thoracic ext over 1/2 foam roll x10 reps at 3-4 different segments      Shoulder Exercises:  Standing   External Rotation  Both;15 reps    Theraband Level (Shoulder External Rotation)  Level 3 (Green)    External Rotation Weight (lbs)  2 sets each, towel roll    Extension  Both;15 reps;Theraband    Theraband Level (Shoulder Extension)  Level 3 (Green)    Extension Weight (lbs)  2 sets    Row  Both;15 reps;Theraband    Theraband Level (Shoulder Row)  Level 3 (Green)    Row Weight (lbs)  2 sets    Diagonals  Both;10 reps;Theraband    Theraband Level (Shoulder Diagonals)  Level 2 (Red)    Other Standing Exercises  bil body blade - horiz and vert perturbations 3x10" each      Shoulder Exercises: Pulleys   Flexion  2 minutes    ABduction  2 minutes      Shoulder Exercises: ROM/Strengthening   UBE (Upper Arm Bike)  4 mins, L4, retro for postural strengthening    Wall Pushups  10 reps    Wall Pushups Limitations  2 sets, cues for form  Manual Therapy   Manual Therapy  Joint mobilization    Manual therapy comments  completed separate rest of treatment    Joint Mobilization  Rt glenohumeral joint: inferior and posterior glide, grade III, at 90 degrees            PT Education - 03/07/18 1118    Education Details  exercise technique, continue HEP    Person(s) Educated  Patient    Methods  Explanation;Demonstration    Comprehension  Verbalized understanding;Returned demonstration       PT Short Term Goals - 03/05/18 0927      PT SHORT TERM GOAL #1   Title  Pt will be independent with HEP and perform consistently in order to decrease shoulder pain.    Time  2    Period  Weeks    Status  Achieved      PT SHORT TERM GOAL #2   Title  Pt will have improved BUE flexion and abd AROM to 160deg with minimal to no pain at end range in order to maximize OH activities at home.    Time  2    Period  Weeks    Status  Achieved        PT Long Term Goals - 03/05/18 1610      PT LONG TERM GOAL #1   Title  Pt will report being able to sleep on her R side without awakening  due to pain in order to demo improved soft tissue restrictions and maximize her overall recovery.    Time  4    Period  Weeks    Status  On-going      PT LONG TERM GOAL #2   Title  Pt will have decreased soft tissue restrictions throughout RTC musculature and biceps to mild in order to decrease her pain and maximize her ROM.     Time  4    Period  Weeks    Status  On-going      PT LONG TERM GOAL #3   Title  Pt will have improved FOTO to 20% limited or better to demo improved ability to perform functional tasks at home with less shoulder pain.     Time  4    Period  Weeks    Status  On-going            Plan - 03/07/18 1200    Clinical Impression Statement  Pt making steady progress towards goals as she is reporting less pain overall. Continued with isolated RTC strengthening and updated HEP to include ER and postural strengthening. Added D2 PNF with RTB and pt with mild pinching on RUE during but it was tolerable and did not increase her pain afterwards. Also added body blade for shoulder mm endurance training and added wall push-ups for strengthening. Ended with manual joint mobs to assist with [facility] arthokinematics and decrease pain with ROM.     Rehab Potential  Good    PT Frequency  2x / week    PT Duration  4 weeks    PT Treatment/Interventions  ADLs/Self Care Home Management;Aquatic Therapy;Cryotherapy;Electrical Stimulation;Moist Heat;Ultrasound;Functional mobility training;Therapeutic activities;Therapeutic exercise;Neuromuscular re-education;Patient/family education;Manual techniques;Passive range of motion;Dry needling;Taping;Spinal Manipulations;Joint Manipulations    PT Next Visit Plan  begin jiont mobs on L GH for improved pain and ROM. Continue postural education and strengthening, isolated RTC strengthening, biceps strengthening. Perform manual for soft tissue restrictions and joint mobility PRN. Include thoracic mobility for postural training and potentially initiate trigger  point  dry needling.    PT Home Exercise Plan  eval: posterior capsule stretch, pec stretch in doorway; 02/20/2018 - side-lying shoulder ER with 1# weight; 9/27: 3D thoracic excursions; 10/3: scap retraction, GH extension, GH ER with towel roll, GTB throughout    Consulted and Agree with Plan of Care  Patient       Patient will benefit from skilled therapeutic intervention in order to improve the following deficits and impairments:  Decreased range of motion, Decreased strength, Hypomobility, Increased fascial restricitons, Increased muscle spasms, Impaired flexibility, Impaired UE functional use, Postural dysfunction, Pain  Visit Diagnosis: Chronic right shoulder pain  Abnormal posture  Other symptoms and signs involving the musculoskeletal system  Chronic left shoulder pain     Problem List Patient Active Problem List   Diagnosis Date Noted  . Chronic pain of both shoulders 01/09/2018       Geraldine Solar PT, DPT  [place] [address] [place], [place], [address] Phone: [phone]   Fax:  [phone]  Name: YIDES SAIDI MRN: [id number] Date of Birth: 1941/01/17

## 2018-03-11 ENCOUNTER — Encounter (HOSPITAL_COMMUNITY): Payer: Self-pay

## 2018-03-11 ENCOUNTER — Ambulatory Visit (HOSPITAL_COMMUNITY): Payer: Medicare Other

## 2018-03-11 DIAGNOSIS — G8929 Other chronic pain: Secondary | ICD-10-CM

## 2018-03-11 DIAGNOSIS — M25511 Pain in right shoulder: Principal | ICD-10-CM

## 2018-03-11 DIAGNOSIS — R293 Abnormal posture: Secondary | ICD-10-CM

## 2018-03-11 DIAGNOSIS — R29898 Other symptoms and signs involving the musculoskeletal system: Secondary | ICD-10-CM

## 2018-03-11 DIAGNOSIS — M25512 Pain in left shoulder: Secondary | ICD-10-CM

## 2018-03-11 NOTE — Therapy (Signed)
[place], [place], [address] Phone: [phone]   Fax:  [phone]  Physical Therapy Treatment  Patient Details  Name: Ruth Murray MRN: [id number] Date of Birth: 01/29/1941 Referring Provider (PT): Joni Fears, MD   Encounter Date: 03/11/2018  PT End of Session - 03/11/18 0947    Visit Number  7    Number of Visits  9    Date for PT Re-Evaluation  03/18/18    Authorization Type  [employer] Medicare    Authorization Time Period  02/18/18 to 03/18/18    Authorization - Visit Number  7    Authorization - Number of Visits  10    PT Start Time  7619    PT Stop Time  5093    PT Time Calculation (min)  39 min    Activity Tolerance  Patient tolerated treatment well;No increased pain    Behavior During Therapy  WFL for tasks assessed/performed       Past Medical History:  Diagnosis Date  . Arthritis   . Hyperlipidemia   . Hypertension     Past Surgical History:  Procedure Laterality Date  . [place]   right  . GANGLION CYST EXCISION  2010   wrist  . TONSILLECTOMY  1965    There were no vitals filed for this visit.  Subjective Assessment - 03/11/18 0948    Subjective  Pt states that she had a good weekend. Overall, she's steadily improving.    Limitations  House hold activities;Lifting    How long can you sit comfortably?  no issues    How long can you stand comfortably?  no issues    How long can you walk comfortably?  no issues    Diagnostic tests  MRI showing RTC and long head biceps tendinopathy without tear    Patient Stated Goals  pain free, or tone it down a little    Currently in Pain?  No/denies             [facility] Adult PT Treatment/Exercise - 03/11/18 0001      Exercises   Exercises  Shoulder      Shoulder Exercises: Seated   Elevation  Both;10 reps;Weights    Elevation Weight (lbs)  3    Elevation Limitations  scaption, 2 sets    Other Seated Exercises   thoracic ext over 1/2 foam roll x10 reps at 3-4 different segments      Shoulder Exercises: Standing   Other Standing Exercises  bil GH ER isometrics 10x10" holds each BTB      Shoulder Exercises: Pulleys   Flexion  2 minutes    ABduction  2 minutes      Shoulder Exercises: ROM/Strengthening   UBE (Upper Arm Bike)  4 mins, L4, retro for postural strengthening    Wall Pushups  10 reps    Wall Pushups Limitations  2 sets, from countertop, much improved form      Manual Therapy   Manual Therapy  Joint mobilization    Manual therapy comments  completed separate rest of treatment    Joint Mobilization  bil GH joint: Grade III-IV inferior and posterior joint mobs with shoulder at 90deg            PT Education - 03/11/18 0947    Education Details  continue HEP    Person(s) Educated  Patient    Methods  Explanation;Demonstration    Comprehension  Verbalized understanding;Returned demonstration       PT Short Term Goals - 03/05/18 0927      PT SHORT TERM GOAL #1   Title  Pt will be independent with HEP and perform consistently in order to decrease shoulder pain.    Time  2    Period  Weeks    Status  Achieved      PT SHORT TERM GOAL #2   Title  Pt will have improved BUE flexion and abd AROM to 160deg with minimal to no pain at end range in order to maximize OH activities at home.    Time  2    Period  Weeks    Status  Achieved        PT Long Term Goals - 03/05/18 9024      PT LONG TERM GOAL #1   Title  Pt will report being able to sleep on her R side without awakening due to pain in order to demo improved soft tissue restrictions and maximize her overall recovery.    Time  4    Period  Weeks    Status  On-going      PT LONG TERM GOAL #2   Title  Pt will have decreased soft tissue restrictions throughout RTC musculature and biceps to mild in order to decrease her pain and maximize her ROM.     Time  4    Period  Weeks    Status  On-going      PT LONG TERM GOAL  #3   Title  Pt will have improved FOTO to 20% limited or better to demo improved ability to perform functional tasks at home with less shoulder pain.     Time  4    Period  Weeks    Status  On-going            Plan - 03/11/18 1028    Clinical Impression Statement  Continued with established POC focusing on bil GH and RTC strengthening as well as addressing bil GH joint mobility. No pain reported during session, except for just mild discomfort at end range abduction > flexion. Resumed isometric GH ER for pain control bilaterally and continued with thoracic mobility work to facilitate improved scapulothoracic joint mobility/arthrokinematics. Initiated joint mobs to L [facility] joint this date to begin to address its limitations in ROM. Overall, pt tolerated session well, no reports of increased pain at EOS. Pt due for reassessment next visit.    Rehab Potential  Good    PT Frequency  2x / week    PT Duration  4 weeks    PT Treatment/Interventions  ADLs/Self Care Home Management;Aquatic Therapy;Cryotherapy;Electrical Stimulation;Moist Heat;Ultrasound;Functional mobility training;Therapeutic activities;Therapeutic exercise;Neuromuscular re-education;Patient/family education;Manual techniques;Passive range of motion;Dry needling;Taping;Spinal Manipulations;Joint Manipulations    PT Next Visit Plan  reassess; begin jiont mobs on L GH for improved pain and ROM. Continue postural education and strengthening, isolated RTC strengthening, biceps strengthening. Perform manual for soft tissue restrictions and joint mobility PRN. Include thoracic mobility for postural training and potentially initiate trigger point dry needling.    PT Home Exercise Plan  eval: posterior capsule stretch, pec stretch in doorway; 02/20/2018 - side-lying shoulder ER with 1# weight; 9/27: 3D thoracic excursions; 10/3: scap retraction, GH extension, GH ER with towel roll, GTB throughout    Consulted and Agree with Plan of Care  Patient        Patient will benefit from skilled therapeutic intervention in order to improve the following deficits  and impairments:  Decreased range of motion, Decreased strength, Hypomobility, Increased fascial restricitons, Increased muscle spasms, Impaired flexibility, Impaired UE functional use, Postural dysfunction, Pain  Visit Diagnosis: Chronic right shoulder pain  Abnormal posture  Other symptoms and signs involving the musculoskeletal system  Chronic left shoulder pain     Problem List Patient Active Problem List   Diagnosis Date Noted  . Chronic pain of both shoulders 01/09/2018        Geraldine Solar PT, DPT  [facility] [address] [place], [place], [address] Phone: [phone]   Fax:  [phone]  Name: Ruth Murray MRN: [id number] Date of Birth: 12/15/1940

## 2018-03-13 ENCOUNTER — Encounter (HOSPITAL_COMMUNITY): Payer: Self-pay

## 2018-03-13 ENCOUNTER — Ambulatory Visit (HOSPITAL_COMMUNITY): Payer: Medicare Other

## 2018-03-13 DIAGNOSIS — M25511 Pain in right shoulder: Principal | ICD-10-CM

## 2018-03-13 DIAGNOSIS — M25512 Pain in left shoulder: Secondary | ICD-10-CM

## 2018-03-13 DIAGNOSIS — R29898 Other symptoms and signs involving the musculoskeletal system: Secondary | ICD-10-CM

## 2018-03-13 DIAGNOSIS — G8929 Other chronic pain: Secondary | ICD-10-CM

## 2018-03-13 DIAGNOSIS — R293 Abnormal posture: Secondary | ICD-10-CM

## 2018-03-13 NOTE — Therapy (Signed)
[facility] [address] [place], [place], [address] Phone: [phone]   Fax:  [phone]   PHYSICAL THERAPY DISCHARGE SUMMARY  Visits from Start of Care: 8  Current functional level related to goals / functional outcomes: See below   Remaining deficits: See below   Education / Equipment: HEP  Plan: Patient agrees to discharge.  Patient goals were met. Patient is being discharged due to meeting the stated rehab goals.  ?????     Physical Therapy Treatment  Patient Details  Name: RICHARDINE PEPPERS MRN: [id number] Date of Birth: 22-Aug-1940 Referring Provider (PT): Joni Fears, MD   Encounter Date: 03/13/2018  PT End of Session - 03/13/18 0902    Visit Number  8    Number of Visits  9    Date for PT Re-Evaluation  03/18/18    Authorization Type  [employer] Medicare    Authorization Time Period  02/18/18 to 03/18/18    Authorization - Visit Number  8    Authorization - Number of Visits  10    PT Start Time  0901    PT Stop Time  0916    PT Time Calculation (min)  15 min    Activity Tolerance  Patient tolerated treatment well;No increased pain    Behavior During Therapy  WFL for tasks assessed/performed       Past Medical History:  Diagnosis Date  . Arthritis   . Hyperlipidemia   . Hypertension     Past Surgical History:  Procedure Laterality Date  . [place]   right  . GANGLION CYST EXCISION  2010   wrist  . TONSILLECTOMY  1965    There were no vitals filed for this visit.  Subjective Assessment - 03/13/18 0902    Subjective  Pt states that her shoulders are feeling pretty good. Overall, she states that her pain is less with movement but it is still slightly there.    Limitations  House hold activities;Lifting    How long can you sit comfortably?  no issues    How long can you stand comfortably?  no issues    How long can you walk comfortably?  no issues    Diagnostic tests  MRI showing  RTC and long head biceps tendinopathy without tear    Patient Stated Goals  pain free, or tone it down a little    Currently in Pain?  No/denies         [facility] PT Assessment - 03/13/18 0001      Assessment   Medical Diagnosis  bil shoulder pain    Referring Provider (PT)  Joni Fears, MD    Onset Date/Surgical Date  --   a few months ago   Hand Dominance  Right    Next MD Visit  no f/u scheduled      Observation/Other Assessments   Focus on Therapeutic Outcomes (FOTO)   26% limited   was 34% limtied     AROM   Right Shoulder Flexion  165 Degrees   was 148   Right Shoulder ABduction  162 Degrees   was 152   Left Shoulder Flexion  167 Degrees   was 154   Left Shoulder ABduction  170 Degrees   was 150     Palpation   Palpation comment  mild   was mod-max restrictions throughout bil RTC mm  PT Education - 03/13/18 0923    Education Details  discharge plans    Person(s) Educated  Patient    Methods  Explanation;Demonstration;Handout    Comprehension  Verbalized understanding;Returned demonstration       PT Short Term Goals - 03/13/18 0904      PT SHORT TERM GOAL #1   Title  Pt will be independent with HEP and perform consistently in order to decrease shoulder pain.    Time  2    Period  Weeks    Status  Achieved      PT SHORT TERM GOAL #2   Title  Pt will have improved BUE flexion and abd AROM to 160deg with minimal to no pain at end range in order to maximize OH activities at home.    Time  2    Period  Weeks    Status  Achieved        PT Long Term Goals - 03/13/18 1497      PT LONG TERM GOAL #1   Title  Pt will report being able to sleep on her R side without awakening due to pain in order to demo improved soft tissue restrictions and maximize her overall recovery.    Baseline  10/9: does not wake her up during the night anymore, but it is still stiff in the mornings    Time  4    Period  Weeks    Status   Achieved      PT LONG TERM GOAL #2   Title  Pt will have decreased soft tissue restrictions throughout RTC musculature and biceps to mild in order to decrease her pain and maximize her ROM.     Time  4    Period  Weeks    Status  On-going      PT LONG TERM GOAL #3   Title  Pt will have improved FOTO to 20% limited or better to demo improved ability to perform functional tasks at home with less shoulder pain.     Time  4    Period  Weeks    Status  On-going            Plan - 03/13/18 0263    Clinical Impression Statement  PT reassessed pt's goals and outcome measures. Pt has made great progress overall as illustrated above as she has met all but 1 goal which was her FOTO goal. Pt states that even though she still has very minor pain with functional activity, overall it has significantly improved. Her sleep is not disturbed anymore and she can reach the backseat of her car with greater ease. At this time, pt is ready for d/c due to progress made. Updated HEP to include more RTC strengthening.     Rehab Potential  Good    PT Frequency  2x / week    PT Duration  4 weeks    PT Treatment/Interventions  ADLs/Self Care Home Management;Aquatic Therapy;Cryotherapy;Electrical Stimulation;Moist Heat;Ultrasound;Functional mobility training;Therapeutic activities;Therapeutic exercise;Neuromuscular re-education;Patient/family education;Manual techniques;Passive range of motion;Dry needling;Taping;Spinal Manipulations;Joint Manipulations    PT Next Visit Plan  discharged    PT Home Exercise Plan  eval: posterior capsule stretch, pec stretch in doorway; 02/20/2018 - side-lying shoulder ER with 1# weight; 9/27: 3D thoracic excursions; 10/3: scap retraction, GH extension, GH ER with towel roll, GTB throughout; 10/9: RTC IR, scaption    Consulted and Agree with Plan of Care  Patient       Patient will benefit from skilled  therapeutic intervention in order to improve the following deficits and  impairments:  Decreased range of motion, Decreased strength, Hypomobility, Increased fascial restricitons, Increased muscle spasms, Impaired flexibility, Impaired UE functional use, Postural dysfunction, Pain  Visit Diagnosis: Chronic right shoulder pain  Abnormal posture  Other symptoms and signs involving the musculoskeletal system  Chronic left shoulder pain     Problem List Patient Active Problem List   Diagnosis Date Noted  . Chronic pain of both shoulders 01/09/2018       Geraldine Solar PT, DPT  [facility] [address] [place], [place], [address] Phone: [phone]   Fax:  [phone]  Name: NYEISHA GOODALL MRN: [id number] Date of Birth: 08/06/1940

## 2018-05-27 ENCOUNTER — Ambulatory Visit (INDEPENDENT_AMBULATORY_CARE_PROVIDER_SITE_OTHER): Payer: Medicare Other | Admitting: Otolaryngology

## 2018-05-27 DIAGNOSIS — H6123 Impacted cerumen, bilateral: Secondary | ICD-10-CM | POA: Diagnosis not present

## 2018-08-20 ENCOUNTER — Other Ambulatory Visit: Payer: Self-pay | Admitting: Internal Medicine

## 2018-08-23 ENCOUNTER — Other Ambulatory Visit: Payer: Self-pay | Admitting: Internal Medicine

## 2018-08-23 DIAGNOSIS — Z1231 Encounter for screening mammogram for malignant neoplasm of breast: Secondary | ICD-10-CM

## 2018-10-10 ENCOUNTER — Ambulatory Visit: Payer: Medicare Other

## 2018-10-15 ENCOUNTER — Other Ambulatory Visit: Payer: Self-pay | Admitting: Dermatology

## 2018-10-15 DIAGNOSIS — C439 Malignant melanoma of skin, unspecified: Secondary | ICD-10-CM

## 2018-10-15 HISTORY — DX: Malignant melanoma of skin, unspecified: C43.9

## 2018-10-21 ENCOUNTER — Ambulatory Visit (INDEPENDENT_AMBULATORY_CARE_PROVIDER_SITE_OTHER): Payer: Medicare Other | Admitting: Otolaryngology

## 2018-10-21 ENCOUNTER — Other Ambulatory Visit: Payer: Self-pay

## 2018-10-21 DIAGNOSIS — H903 Sensorineural hearing loss, bilateral: Secondary | ICD-10-CM | POA: Diagnosis not present

## 2018-10-21 DIAGNOSIS — H6122 Impacted cerumen, left ear: Secondary | ICD-10-CM

## 2018-11-11 ENCOUNTER — Other Ambulatory Visit: Payer: Self-pay

## 2018-11-11 ENCOUNTER — Ambulatory Visit
Admission: RE | Admit: 2018-11-11 | Discharge: 2018-11-11 | Disposition: A | Discharge status: Home / Self Care | Payer: Medicare Other | Source: Ambulatory Visit | Attending: Internal Medicine | Admitting: Internal Medicine

## 2018-11-11 DIAGNOSIS — Z1231 Encounter for screening mammogram for malignant neoplasm of breast: Secondary | ICD-10-CM

## 2018-12-23 ENCOUNTER — Encounter: Payer: Self-pay | Admitting: *Deleted

## 2019-06-18 DIAGNOSIS — G2581 Restless legs syndrome: Secondary | ICD-10-CM | POA: Diagnosis not present

## 2019-06-18 DIAGNOSIS — I10 Essential (primary) hypertension: Secondary | ICD-10-CM | POA: Diagnosis not present

## 2019-07-23 DIAGNOSIS — M85862 Other specified disorders of bone density and structure, left lower leg: Secondary | ICD-10-CM | POA: Diagnosis not present

## 2019-07-23 DIAGNOSIS — M25551 Pain in right hip: Secondary | ICD-10-CM | POA: Diagnosis not present

## 2019-07-23 DIAGNOSIS — Z96641 Presence of right artificial hip joint: Secondary | ICD-10-CM | POA: Diagnosis not present

## 2019-07-23 DIAGNOSIS — M7061 Trochanteric bursitis, right hip: Secondary | ICD-10-CM | POA: Diagnosis not present

## 2019-07-23 DIAGNOSIS — M11251 Other chondrocalcinosis, right hip: Secondary | ICD-10-CM | POA: Diagnosis not present

## 2019-07-23 DIAGNOSIS — M1612 Unilateral primary osteoarthritis, left hip: Secondary | ICD-10-CM | POA: Diagnosis not present

## 2019-07-23 DIAGNOSIS — Z471 Aftercare following joint replacement surgery: Secondary | ICD-10-CM | POA: Diagnosis not present

## 2019-07-23 DIAGNOSIS — Z96652 Presence of left artificial knee joint: Secondary | ICD-10-CM | POA: Diagnosis not present

## 2019-07-23 DIAGNOSIS — M47816 Spondylosis without myelopathy or radiculopathy, lumbar region: Secondary | ICD-10-CM | POA: Diagnosis not present

## 2019-07-23 DIAGNOSIS — M461 Sacroiliitis, not elsewhere classified: Secondary | ICD-10-CM | POA: Diagnosis not present

## 2019-07-23 DIAGNOSIS — M25562 Pain in left knee: Secondary | ICD-10-CM | POA: Diagnosis not present

## 2019-07-23 DIAGNOSIS — M8588 Other specified disorders of bone density and structure, other site: Secondary | ICD-10-CM | POA: Diagnosis not present

## 2019-08-21 DIAGNOSIS — M7061 Trochanteric bursitis, right hip: Secondary | ICD-10-CM | POA: Diagnosis not present

## 2019-08-25 DIAGNOSIS — Z01419 Encounter for gynecological examination (general) (routine) without abnormal findings: Secondary | ICD-10-CM | POA: Diagnosis not present

## 2019-08-25 DIAGNOSIS — Z6822 Body mass index (BMI) 22.0-22.9, adult: Secondary | ICD-10-CM | POA: Diagnosis not present

## 2019-08-25 DIAGNOSIS — Z124 Encounter for screening for malignant neoplasm of cervix: Secondary | ICD-10-CM | POA: Diagnosis not present

## 2019-10-22 DIAGNOSIS — H903 Sensorineural hearing loss, bilateral: Secondary | ICD-10-CM | POA: Diagnosis not present

## 2019-10-22 DIAGNOSIS — H6123 Impacted cerumen, bilateral: Secondary | ICD-10-CM | POA: Diagnosis not present

## 2019-11-05 ENCOUNTER — Other Ambulatory Visit: Payer: Self-pay | Admitting: Internal Medicine

## 2019-11-05 DIAGNOSIS — Z1231 Encounter for screening mammogram for malignant neoplasm of breast: Secondary | ICD-10-CM

## 2019-11-13 ENCOUNTER — Ambulatory Visit
Admission: RE | Admit: 2019-11-13 | Discharge: 2019-11-13 | Disposition: A | Discharge status: Home / Self Care | Payer: Medicare Other | Source: Ambulatory Visit | Attending: Internal Medicine | Admitting: Internal Medicine

## 2019-11-13 ENCOUNTER — Other Ambulatory Visit: Payer: Self-pay

## 2019-11-13 DIAGNOSIS — Z1231 Encounter for screening mammogram for malignant neoplasm of breast: Secondary | ICD-10-CM

## 2019-12-10 DIAGNOSIS — M109 Gout, unspecified: Secondary | ICD-10-CM | POA: Diagnosis not present

## 2019-12-10 DIAGNOSIS — G2581 Restless legs syndrome: Secondary | ICD-10-CM | POA: Diagnosis not present

## 2019-12-10 DIAGNOSIS — M1990 Unspecified osteoarthritis, unspecified site: Secondary | ICD-10-CM | POA: Diagnosis not present

## 2019-12-10 DIAGNOSIS — I10 Essential (primary) hypertension: Secondary | ICD-10-CM | POA: Diagnosis not present

## 2019-12-10 DIAGNOSIS — Z79899 Other long term (current) drug therapy: Secondary | ICD-10-CM | POA: Diagnosis not present

## 2019-12-17 DIAGNOSIS — I10 Essential (primary) hypertension: Secondary | ICD-10-CM | POA: Diagnosis not present

## 2019-12-17 DIAGNOSIS — E785 Hyperlipidemia, unspecified: Secondary | ICD-10-CM | POA: Diagnosis not present

## 2019-12-18 ENCOUNTER — Other Ambulatory Visit (HOSPITAL_COMMUNITY): Payer: Self-pay | Admitting: Internal Medicine

## 2019-12-18 DIAGNOSIS — M8580 Other specified disorders of bone density and structure, unspecified site: Secondary | ICD-10-CM

## 2019-12-18 DIAGNOSIS — Z780 Asymptomatic menopausal state: Secondary | ICD-10-CM

## 2020-01-01 ENCOUNTER — Other Ambulatory Visit: Payer: Self-pay

## 2020-01-01 ENCOUNTER — Ambulatory Visit (HOSPITAL_COMMUNITY)
Admission: RE | Admit: 2020-01-01 | Discharge: 2020-01-01 | Disposition: A | Discharge status: Home / Self Care | Payer: Medicare Other | Source: Ambulatory Visit | Attending: Internal Medicine | Admitting: Internal Medicine

## 2020-01-01 DIAGNOSIS — Z780 Asymptomatic menopausal state: Secondary | ICD-10-CM | POA: Diagnosis not present

## 2020-01-01 DIAGNOSIS — M85852 Other specified disorders of bone density and structure, left thigh: Secondary | ICD-10-CM | POA: Diagnosis not present

## 2020-03-03 ENCOUNTER — Ambulatory Visit: Payer: Medicare Other | Admitting: Dermatology

## 2020-03-23 DIAGNOSIS — Z23 Encounter for immunization: Secondary | ICD-10-CM | POA: Diagnosis not present

## 2020-03-30 ENCOUNTER — Encounter: Payer: Self-pay | Admitting: Dermatology

## 2020-03-30 ENCOUNTER — Other Ambulatory Visit: Payer: Self-pay

## 2020-03-30 ENCOUNTER — Ambulatory Visit (INDEPENDENT_AMBULATORY_CARE_PROVIDER_SITE_OTHER): Payer: Medicare Other | Admitting: Dermatology

## 2020-03-30 DIAGNOSIS — Z85820 Personal history of malignant melanoma of skin: Secondary | ICD-10-CM

## 2020-03-30 DIAGNOSIS — Z1283 Encounter for screening for malignant neoplasm of skin: Secondary | ICD-10-CM | POA: Diagnosis not present

## 2020-03-30 DIAGNOSIS — Z85828 Personal history of other malignant neoplasm of skin: Secondary | ICD-10-CM | POA: Diagnosis not present

## 2020-03-30 DIAGNOSIS — L821 Other seborrheic keratosis: Secondary | ICD-10-CM

## 2020-03-30 DIAGNOSIS — Z872 Personal history of diseases of the skin and subcutaneous tissue: Secondary | ICD-10-CM

## 2020-03-30 DIAGNOSIS — Z86018 Personal history of other benign neoplasm: Secondary | ICD-10-CM

## 2020-03-30 DIAGNOSIS — B351 Tinea unguium: Secondary | ICD-10-CM

## 2020-04-29 ENCOUNTER — Encounter: Payer: Self-pay | Admitting: Dermatology

## 2020-04-29 NOTE — Progress Notes (Signed)
   Follow-Up Visit   Subjective  Ruth Murray is a 79 y.o. female who presents for the following: Annual Exam (NO CONCERNS).  Complete skin examination Location:  Duration:  Quality:  Associated Signs/Symptoms: Modifying Factors:  Severity:  Timing: Context: History lentigo maligna right thigh and melanoma right side of nose and basal cell carcinoma  chin.  Objective  Well appearing patient in no apparent distress; mood and affect are within normal limits.  A full examination was performed including scalp, head, eyes, ears, nose, lips, neck, chest, axillae, abdomen, back, buttocks, bilateral upper extremities, bilateral lower extremities, hands, feet, fingers, toes, fingernails, and toenails. All findings within normal limits unless otherwise noted below.   Assessment & Plan    Personal history of malignant melanoma of skin Right Nasal Sidewall  Annual skin examination  History of basal cell carcinoma (BCC) Left Chin  Annual skin check  Seborrheic keratosis (4) Left Foot - Anterior; Left Lower Leg - Anterior; Right Lower Leg - Anterior; Left Abdomen (side) - Lower  Benign no treatment needed if stable  Toenail fungus (2) Left Hallux Toe Nail Plate; Right Hallux Toe Nail Plate  All treatment options reviewed.  Only systemic antifungals over 3 to 4 months have a decent clearance rate, but recurrences, and and there are remote very serious side effects.  No intervention for now.  Skin exam for malignant neoplasm (2) Chest - Medial [facility]); Right Triangular Fossa  Yearly skin check     I, [name], MD, have reviewed all documentation for this visit.  The documentation on 04/29/20 for the exam, diagnosis, procedures, and orders are all accurate and complete.

## 2020-05-04 DIAGNOSIS — H35371 Puckering of macula, right eye: Secondary | ICD-10-CM | POA: Diagnosis not present

## 2020-06-16 DIAGNOSIS — H93243 Temporary auditory threshold shift, bilateral: Secondary | ICD-10-CM | POA: Diagnosis not present

## 2020-06-16 DIAGNOSIS — M65341 Trigger finger, right ring finger: Secondary | ICD-10-CM | POA: Diagnosis not present

## 2020-06-16 DIAGNOSIS — G2581 Restless legs syndrome: Secondary | ICD-10-CM | POA: Diagnosis not present

## 2020-06-16 DIAGNOSIS — I10 Essential (primary) hypertension: Secondary | ICD-10-CM | POA: Diagnosis not present

## 2020-06-28 DIAGNOSIS — M79642 Pain in left hand: Secondary | ICD-10-CM | POA: Diagnosis not present

## 2020-06-28 DIAGNOSIS — M180 Bilateral primary osteoarthritis of first carpometacarpal joints: Secondary | ICD-10-CM | POA: Diagnosis not present

## 2020-06-28 DIAGNOSIS — M79641 Pain in right hand: Secondary | ICD-10-CM | POA: Diagnosis not present

## 2020-06-28 DIAGNOSIS — M65341 Trigger finger, right ring finger: Secondary | ICD-10-CM | POA: Diagnosis not present

## 2020-07-02 DIAGNOSIS — H353111 Nonexudative age-related macular degeneration, right eye, early dry stage: Secondary | ICD-10-CM | POA: Diagnosis not present

## 2020-07-02 DIAGNOSIS — H43811 Vitreous degeneration, right eye: Secondary | ICD-10-CM | POA: Diagnosis not present

## 2020-07-02 DIAGNOSIS — H35373 Puckering of macula, bilateral: Secondary | ICD-10-CM | POA: Diagnosis not present

## 2020-07-02 DIAGNOSIS — H3581 Retinal edema: Secondary | ICD-10-CM | POA: Diagnosis not present

## 2020-07-16 DIAGNOSIS — H18413 Arcus senilis, bilateral: Secondary | ICD-10-CM | POA: Diagnosis not present

## 2020-07-16 DIAGNOSIS — H25043 Posterior subcapsular polar age-related cataract, bilateral: Secondary | ICD-10-CM | POA: Diagnosis not present

## 2020-07-16 DIAGNOSIS — H2512 Age-related nuclear cataract, left eye: Secondary | ICD-10-CM | POA: Diagnosis not present

## 2020-07-16 DIAGNOSIS — H35373 Puckering of macula, bilateral: Secondary | ICD-10-CM | POA: Diagnosis not present

## 2020-07-16 DIAGNOSIS — H2513 Age-related nuclear cataract, bilateral: Secondary | ICD-10-CM | POA: Diagnosis not present

## 2020-07-19 DIAGNOSIS — M10072 Idiopathic gout, left ankle and foot: Secondary | ICD-10-CM | POA: Diagnosis not present

## 2020-07-19 DIAGNOSIS — M79675 Pain in left toe(s): Secondary | ICD-10-CM | POA: Diagnosis not present

## 2020-07-19 DIAGNOSIS — M79672 Pain in left foot: Secondary | ICD-10-CM | POA: Diagnosis not present

## 2020-07-19 DIAGNOSIS — M1990 Unspecified osteoarthritis, unspecified site: Secondary | ICD-10-CM | POA: Diagnosis not present

## 2020-07-30 DIAGNOSIS — M65341 Trigger finger, right ring finger: Secondary | ICD-10-CM | POA: Diagnosis not present

## 2020-08-02 DIAGNOSIS — H3581 Retinal edema: Secondary | ICD-10-CM | POA: Diagnosis not present

## 2020-08-02 DIAGNOSIS — H35372 Puckering of macula, left eye: Secondary | ICD-10-CM | POA: Diagnosis not present

## 2020-08-02 DIAGNOSIS — H2512 Age-related nuclear cataract, left eye: Secondary | ICD-10-CM | POA: Diagnosis not present

## 2020-08-02 DIAGNOSIS — H33312 Horseshoe tear of retina without detachment, left eye: Secondary | ICD-10-CM | POA: Diagnosis not present

## 2020-08-03 DIAGNOSIS — H4322 Crystalline deposits in vitreous body, left eye: Secondary | ICD-10-CM | POA: Diagnosis not present

## 2020-08-10 DIAGNOSIS — H35373 Puckering of macula, bilateral: Secondary | ICD-10-CM | POA: Diagnosis not present

## 2020-08-16 DIAGNOSIS — M79672 Pain in left foot: Secondary | ICD-10-CM | POA: Diagnosis not present

## 2020-08-16 DIAGNOSIS — M79675 Pain in left toe(s): Secondary | ICD-10-CM | POA: Diagnosis not present

## 2020-08-16 DIAGNOSIS — L03032 Cellulitis of left toe: Secondary | ICD-10-CM | POA: Diagnosis not present

## 2020-08-30 DIAGNOSIS — M2042 Other hammer toe(s) (acquired), left foot: Secondary | ICD-10-CM | POA: Diagnosis not present

## 2020-08-30 DIAGNOSIS — M79672 Pain in left foot: Secondary | ICD-10-CM | POA: Diagnosis not present

## 2020-08-30 DIAGNOSIS — M79675 Pain in left toe(s): Secondary | ICD-10-CM | POA: Diagnosis not present

## 2020-09-03 DIAGNOSIS — H903 Sensorineural hearing loss, bilateral: Secondary | ICD-10-CM | POA: Diagnosis not present

## 2020-09-03 DIAGNOSIS — H6123 Impacted cerumen, bilateral: Secondary | ICD-10-CM | POA: Diagnosis not present

## 2020-09-03 DIAGNOSIS — H35373 Puckering of macula, bilateral: Secondary | ICD-10-CM | POA: Diagnosis not present

## 2020-09-13 DIAGNOSIS — M19041 Primary osteoarthritis, right hand: Secondary | ICD-10-CM | POA: Diagnosis not present

## 2020-09-13 DIAGNOSIS — M65341 Trigger finger, right ring finger: Secondary | ICD-10-CM | POA: Diagnosis not present

## 2020-09-20 DIAGNOSIS — M2042 Other hammer toe(s) (acquired), left foot: Secondary | ICD-10-CM | POA: Diagnosis not present

## 2020-09-20 DIAGNOSIS — M79672 Pain in left foot: Secondary | ICD-10-CM | POA: Diagnosis not present

## 2020-09-20 DIAGNOSIS — M79675 Pain in left toe(s): Secondary | ICD-10-CM | POA: Diagnosis not present

## 2020-09-28 DIAGNOSIS — H2511 Age-related nuclear cataract, right eye: Secondary | ICD-10-CM | POA: Diagnosis not present

## 2020-10-08 ENCOUNTER — Other Ambulatory Visit: Payer: Self-pay | Admitting: Internal Medicine

## 2020-10-08 DIAGNOSIS — Z1231 Encounter for screening mammogram for malignant neoplasm of breast: Secondary | ICD-10-CM

## 2020-11-03 DIAGNOSIS — M25519 Pain in unspecified shoulder: Secondary | ICD-10-CM | POA: Diagnosis not present

## 2020-11-05 DIAGNOSIS — M25512 Pain in left shoulder: Secondary | ICD-10-CM | POA: Diagnosis not present

## 2020-11-05 DIAGNOSIS — M25511 Pain in right shoulder: Secondary | ICD-10-CM | POA: Diagnosis not present

## 2020-11-08 ENCOUNTER — Ambulatory Visit: Payer: Medicare Other | Admitting: Dermatology

## 2020-11-17 ENCOUNTER — Other Ambulatory Visit: Payer: Self-pay

## 2020-11-17 ENCOUNTER — Encounter: Payer: Self-pay | Admitting: Dermatology

## 2020-11-17 ENCOUNTER — Ambulatory Visit: Payer: Medicare Other | Admitting: Dermatology

## 2020-11-17 DIAGNOSIS — L821 Other seborrheic keratosis: Secondary | ICD-10-CM | POA: Diagnosis not present

## 2020-11-17 DIAGNOSIS — Z85820 Personal history of malignant melanoma of skin: Secondary | ICD-10-CM

## 2020-11-21 ENCOUNTER — Encounter: Payer: Self-pay | Admitting: Dermatology

## 2020-11-21 NOTE — Progress Notes (Signed)
   Follow-Up Visit   Subjective  Ruth Murray is a 80 y.o. female who presents for the following: Skin Problem (Behind right ear x months in a rub area near hearing aids).  Simple change in pigmented spot in back of right upper ear. Location:  Duration:  Quality:  Associated Signs/Symptoms: Modifying Factors:  Severity:  Timing: Context:   Objective  Well appearing patient in no apparent distress; mood and affect are within normal limits. Right Ear 1.2cm flattopped textured monochrome brown papule; dermoscopy typical.     Dorsum of Nose Is Ruth Murray has a personal history of 2 primary melanomas.  Although there is no sign of recurrence it certainly understandable if new pigmented lesions concern her.    A focused examination was performed including head and neck. Relevant physical exam findings are noted in the Assessment and Plan.   Assessment & Plan    Seborrheic keratosis Right Ear  Because of Ruth Murray history of melanoma I did offer to obtain confirmatory biopsy, but she felt comfortable leaving the spot unless there is clinical change.  Personal history of malignant melanoma of skin Dorsum of Nose  Continue annual examinations plus of course we will check any new or changing pigmented lesions in the interim.      I, Ruth Monarch, MD, have reviewed all documentation for this visit.  The documentation on 11/21/20 for the exam, diagnosis, procedures, and orders are all accurate and complete.

## 2020-12-03 ENCOUNTER — Ambulatory Visit: Payer: Medicare Other

## 2020-12-03 DIAGNOSIS — M25511 Pain in right shoulder: Secondary | ICD-10-CM | POA: Diagnosis not present

## 2020-12-03 DIAGNOSIS — M25512 Pain in left shoulder: Secondary | ICD-10-CM | POA: Diagnosis not present

## 2020-12-08 ENCOUNTER — Other Ambulatory Visit: Payer: Self-pay | Admitting: Orthopedic Surgery

## 2020-12-08 DIAGNOSIS — M109 Gout, unspecified: Secondary | ICD-10-CM | POA: Diagnosis not present

## 2020-12-08 DIAGNOSIS — Z79899 Other long term (current) drug therapy: Secondary | ICD-10-CM | POA: Diagnosis not present

## 2020-12-08 DIAGNOSIS — G2581 Restless legs syndrome: Secondary | ICD-10-CM | POA: Diagnosis not present

## 2020-12-08 DIAGNOSIS — I10 Essential (primary) hypertension: Secondary | ICD-10-CM | POA: Diagnosis not present

## 2020-12-08 DIAGNOSIS — M75100 Unspecified rotator cuff tear or rupture of unspecified shoulder, not specified as traumatic: Secondary | ICD-10-CM

## 2020-12-13 DIAGNOSIS — H3581 Retinal edema: Secondary | ICD-10-CM | POA: Diagnosis not present

## 2020-12-13 DIAGNOSIS — H35371 Puckering of macula, right eye: Secondary | ICD-10-CM | POA: Diagnosis not present

## 2020-12-13 DIAGNOSIS — H2511 Age-related nuclear cataract, right eye: Secondary | ICD-10-CM | POA: Diagnosis not present

## 2020-12-13 DIAGNOSIS — H33321 Round hole, right eye: Secondary | ICD-10-CM | POA: Diagnosis not present

## 2020-12-14 DIAGNOSIS — H35371 Puckering of macula, right eye: Secondary | ICD-10-CM | POA: Diagnosis not present

## 2020-12-16 ENCOUNTER — Other Ambulatory Visit: Payer: Medicare Other

## 2020-12-17 DIAGNOSIS — M1389 Other specified arthritis, multiple sites: Secondary | ICD-10-CM | POA: Diagnosis not present

## 2020-12-17 DIAGNOSIS — E785 Hyperlipidemia, unspecified: Secondary | ICD-10-CM | POA: Diagnosis not present

## 2020-12-17 DIAGNOSIS — I10 Essential (primary) hypertension: Secondary | ICD-10-CM | POA: Diagnosis not present

## 2020-12-17 DIAGNOSIS — M109 Gout, unspecified: Secondary | ICD-10-CM | POA: Diagnosis not present

## 2020-12-17 DIAGNOSIS — M25519 Pain in unspecified shoulder: Secondary | ICD-10-CM | POA: Diagnosis not present

## 2020-12-21 ENCOUNTER — Ambulatory Visit
Admission: RE | Admit: 2020-12-21 | Discharge: 2020-12-21 | Disposition: A | Discharge status: Home / Self Care | Payer: Medicare Other | Source: Ambulatory Visit | Attending: Orthopedic Surgery | Admitting: Orthopedic Surgery

## 2020-12-21 ENCOUNTER — Other Ambulatory Visit: Payer: Self-pay

## 2020-12-21 DIAGNOSIS — H35371 Puckering of macula, right eye: Secondary | ICD-10-CM | POA: Diagnosis not present

## 2020-12-21 DIAGNOSIS — M75100 Unspecified rotator cuff tear or rupture of unspecified shoulder, not specified as traumatic: Secondary | ICD-10-CM

## 2020-12-21 DIAGNOSIS — M25411 Effusion, right shoulder: Secondary | ICD-10-CM | POA: Diagnosis not present

## 2020-12-21 DIAGNOSIS — M19012 Primary osteoarthritis, left shoulder: Secondary | ICD-10-CM | POA: Diagnosis not present

## 2020-12-21 DIAGNOSIS — M7552 Bursitis of left shoulder: Secondary | ICD-10-CM | POA: Diagnosis not present

## 2020-12-21 DIAGNOSIS — H3581 Retinal edema: Secondary | ICD-10-CM | POA: Diagnosis not present

## 2020-12-21 DIAGNOSIS — M19011 Primary osteoarthritis, right shoulder: Secondary | ICD-10-CM | POA: Diagnosis not present

## 2020-12-24 DIAGNOSIS — M25512 Pain in left shoulder: Secondary | ICD-10-CM | POA: Diagnosis not present

## 2020-12-24 DIAGNOSIS — M25511 Pain in right shoulder: Secondary | ICD-10-CM | POA: Diagnosis not present

## 2020-12-27 ENCOUNTER — Ambulatory Visit
Admission: RE | Admit: 2020-12-27 | Discharge: 2020-12-27 | Disposition: A | Discharge status: Home / Self Care | Payer: Medicare Other | Source: Ambulatory Visit | Attending: Internal Medicine | Admitting: Internal Medicine

## 2020-12-27 ENCOUNTER — Other Ambulatory Visit: Payer: Self-pay

## 2020-12-27 DIAGNOSIS — Z1231 Encounter for screening mammogram for malignant neoplasm of breast: Secondary | ICD-10-CM | POA: Diagnosis not present

## 2021-01-05 DIAGNOSIS — M7552 Bursitis of left shoulder: Secondary | ICD-10-CM | POA: Diagnosis not present

## 2021-01-05 DIAGNOSIS — M24112 Other articular cartilage disorders, left shoulder: Secondary | ICD-10-CM | POA: Diagnosis not present

## 2021-01-05 DIAGNOSIS — M7542 Impingement syndrome of left shoulder: Secondary | ICD-10-CM | POA: Diagnosis not present

## 2021-01-05 DIAGNOSIS — M11212 Other chondrocalcinosis, left shoulder: Secondary | ICD-10-CM | POA: Diagnosis not present

## 2021-01-05 DIAGNOSIS — M19012 Primary osteoarthritis, left shoulder: Secondary | ICD-10-CM | POA: Diagnosis not present

## 2021-01-05 DIAGNOSIS — G8918 Other acute postprocedural pain: Secondary | ICD-10-CM | POA: Diagnosis not present

## 2021-01-05 DIAGNOSIS — M7551 Bursitis of right shoulder: Secondary | ICD-10-CM | POA: Diagnosis not present

## 2021-01-05 DIAGNOSIS — M7582 Other shoulder lesions, left shoulder: Secondary | ICD-10-CM | POA: Diagnosis not present

## 2021-01-05 DIAGNOSIS — M19011 Primary osteoarthritis, right shoulder: Secondary | ICD-10-CM | POA: Diagnosis not present

## 2021-01-10 DIAGNOSIS — M25512 Pain in left shoulder: Secondary | ICD-10-CM | POA: Diagnosis not present

## 2021-01-11 DIAGNOSIS — H35371 Puckering of macula, right eye: Secondary | ICD-10-CM | POA: Diagnosis not present

## 2021-01-11 DIAGNOSIS — H59032 Cystoid macular edema following cataract surgery, left eye: Secondary | ICD-10-CM | POA: Diagnosis not present

## 2021-01-13 DIAGNOSIS — M25512 Pain in left shoulder: Secondary | ICD-10-CM | POA: Diagnosis not present

## 2021-01-14 DIAGNOSIS — M25512 Pain in left shoulder: Secondary | ICD-10-CM | POA: Diagnosis not present

## 2021-01-14 DIAGNOSIS — M25511 Pain in right shoulder: Secondary | ICD-10-CM | POA: Diagnosis not present

## 2021-01-18 DIAGNOSIS — M25512 Pain in left shoulder: Secondary | ICD-10-CM | POA: Diagnosis not present

## 2021-01-20 DIAGNOSIS — M25512 Pain in left shoulder: Secondary | ICD-10-CM | POA: Diagnosis not present

## 2021-01-21 DIAGNOSIS — H30042 Focal chorioretinal inflammation, macular or paramacular, left eye: Secondary | ICD-10-CM | POA: Diagnosis not present

## 2021-01-25 DIAGNOSIS — M25512 Pain in left shoulder: Secondary | ICD-10-CM | POA: Diagnosis not present

## 2021-01-27 DIAGNOSIS — M25512 Pain in left shoulder: Secondary | ICD-10-CM | POA: Diagnosis not present

## 2021-01-31 DIAGNOSIS — M25512 Pain in left shoulder: Secondary | ICD-10-CM | POA: Diagnosis not present

## 2021-02-08 DIAGNOSIS — M25512 Pain in left shoulder: Secondary | ICD-10-CM | POA: Diagnosis not present

## 2021-02-10 DIAGNOSIS — M25512 Pain in left shoulder: Secondary | ICD-10-CM | POA: Diagnosis not present

## 2021-02-11 DIAGNOSIS — M25511 Pain in right shoulder: Secondary | ICD-10-CM | POA: Diagnosis not present

## 2021-02-11 DIAGNOSIS — M25512 Pain in left shoulder: Secondary | ICD-10-CM | POA: Diagnosis not present

## 2021-02-14 ENCOUNTER — Other Ambulatory Visit: Payer: Self-pay | Admitting: Orthopedic Surgery

## 2021-02-14 DIAGNOSIS — M1812 Unilateral primary osteoarthritis of first carpometacarpal joint, left hand: Secondary | ICD-10-CM | POA: Diagnosis not present

## 2021-02-14 DIAGNOSIS — M65341 Trigger finger, right ring finger: Secondary | ICD-10-CM | POA: Diagnosis not present

## 2021-02-14 DIAGNOSIS — M65331 Trigger finger, right middle finger: Secondary | ICD-10-CM | POA: Diagnosis not present

## 2021-02-18 DIAGNOSIS — H30042 Focal chorioretinal inflammation, macular or paramacular, left eye: Secondary | ICD-10-CM | POA: Diagnosis not present

## 2021-02-18 DIAGNOSIS — M25542 Pain in joints of left hand: Secondary | ICD-10-CM | POA: Diagnosis not present

## 2021-02-18 DIAGNOSIS — M25642 Stiffness of left hand, not elsewhere classified: Secondary | ICD-10-CM | POA: Diagnosis not present

## 2021-02-18 DIAGNOSIS — H59032 Cystoid macular edema following cataract surgery, left eye: Secondary | ICD-10-CM | POA: Diagnosis not present

## 2021-02-18 DIAGNOSIS — M1812 Unilateral primary osteoarthritis of first carpometacarpal joint, left hand: Secondary | ICD-10-CM | POA: Diagnosis not present

## 2021-03-07 ENCOUNTER — Encounter (HOSPITAL_BASED_OUTPATIENT_CLINIC_OR_DEPARTMENT_OTHER): Payer: Self-pay | Admitting: Orthopedic Surgery

## 2021-03-14 ENCOUNTER — Encounter (HOSPITAL_BASED_OUTPATIENT_CLINIC_OR_DEPARTMENT_OTHER)
Admission: RE | Admit: 2021-03-14 | Discharge: 2021-03-14 | Disposition: A | Discharge status: Home / Self Care | Payer: Medicare Other | Source: Ambulatory Visit | Attending: Orthopedic Surgery | Admitting: Orthopedic Surgery

## 2021-03-14 DIAGNOSIS — M65331 Trigger finger, right middle finger: Secondary | ICD-10-CM | POA: Diagnosis not present

## 2021-03-14 DIAGNOSIS — Z87891 Personal history of nicotine dependence: Secondary | ICD-10-CM | POA: Diagnosis not present

## 2021-03-14 DIAGNOSIS — Z882 Allergy status to sulfonamides status: Secondary | ICD-10-CM | POA: Diagnosis not present

## 2021-03-14 DIAGNOSIS — Z01810 Encounter for preprocedural cardiovascular examination: Secondary | ICD-10-CM | POA: Insufficient documentation

## 2021-03-14 DIAGNOSIS — Z885 Allergy status to narcotic agent status: Secondary | ICD-10-CM | POA: Diagnosis not present

## 2021-03-14 DIAGNOSIS — M65341 Trigger finger, right ring finger: Secondary | ICD-10-CM | POA: Diagnosis not present

## 2021-03-14 DIAGNOSIS — Z91048 Other nonmedicinal substance allergy status: Secondary | ICD-10-CM | POA: Diagnosis not present

## 2021-03-14 NOTE — Progress Notes (Signed)

## 2021-03-14 NOTE — Progress Notes (Signed)
Sent text reminding pt to come for EKG today.

## 2021-03-15 ENCOUNTER — Encounter (HOSPITAL_BASED_OUTPATIENT_CLINIC_OR_DEPARTMENT_OTHER): Payer: Self-pay | Admitting: Orthopedic Surgery

## 2021-03-15 ENCOUNTER — Encounter (HOSPITAL_BASED_OUTPATIENT_CLINIC_OR_DEPARTMENT_OTHER)
Admission: RE | Disposition: A | Discharge status: Home / Self Care | Payer: Self-pay | Source: Home / Self Care | Attending: Orthopedic Surgery

## 2021-03-15 ENCOUNTER — Ambulatory Visit (HOSPITAL_BASED_OUTPATIENT_CLINIC_OR_DEPARTMENT_OTHER): Payer: Medicare Other | Admitting: Anesthesiology

## 2021-03-15 ENCOUNTER — Ambulatory Visit (HOSPITAL_BASED_OUTPATIENT_CLINIC_OR_DEPARTMENT_OTHER)
Admission: RE | Admit: 2021-03-15 | Discharge: 2021-03-15 | Disposition: A | Discharge status: Home / Self Care | Payer: Medicare Other | Attending: Orthopedic Surgery | Admitting: Orthopedic Surgery

## 2021-03-15 ENCOUNTER — Other Ambulatory Visit: Payer: Self-pay

## 2021-03-15 DIAGNOSIS — M65341 Trigger finger, right ring finger: Secondary | ICD-10-CM | POA: Insufficient documentation

## 2021-03-15 DIAGNOSIS — Z87891 Personal history of nicotine dependence: Secondary | ICD-10-CM | POA: Insufficient documentation

## 2021-03-15 DIAGNOSIS — M65331 Trigger finger, right middle finger: Secondary | ICD-10-CM | POA: Diagnosis not present

## 2021-03-15 DIAGNOSIS — M659 Synovitis and tenosynovitis, unspecified: Secondary | ICD-10-CM | POA: Diagnosis not present

## 2021-03-15 DIAGNOSIS — Z882 Allergy status to sulfonamides status: Secondary | ICD-10-CM | POA: Insufficient documentation

## 2021-03-15 DIAGNOSIS — Z91048 Other nonmedicinal substance allergy status: Secondary | ICD-10-CM | POA: Diagnosis not present

## 2021-03-15 DIAGNOSIS — Z885 Allergy status to narcotic agent status: Secondary | ICD-10-CM | POA: Insufficient documentation

## 2021-03-15 DIAGNOSIS — E785 Hyperlipidemia, unspecified: Secondary | ICD-10-CM | POA: Diagnosis not present

## 2021-03-15 HISTORY — PX: TRIGGER FINGER RELEASE: SHX641

## 2021-03-15 SURGERY — RELEASE, A1 PULLEY, FOR TRIGGER FINGER
Anesthesia: General | Site: Hand | Laterality: Right

## 2021-03-15 MED ORDER — OXYCODONE HCL 5 MG PO TABS: 5.0000 mg | ORAL_TABLET | Freq: Once | ORAL | Status: DC | PRN

## 2021-03-15 MED ORDER — FENTANYL CITRATE (PF) 100 MCG/2ML IJ SOLN
INTRAMUSCULAR | Status: DC | PRN
  Administered 2021-03-15: 25 ug via INTRAVENOUS
  Administered 2021-03-15: 50 ug via INTRAVENOUS

## 2021-03-15 MED ORDER — LIDOCAINE 2% (20 MG/ML) 5 ML SYRINGE
INTRAMUSCULAR | Status: AC
  Filled 2021-03-15: qty 5

## 2021-03-15 MED ORDER — TRAMADOL HCL 50 MG PO TABS: 50.0000 mg | ORAL_TABLET | Freq: Four times a day (QID) | ORAL | 0 refills | Status: DC | PRN

## 2021-03-15 MED ORDER — DEXAMETHASONE SODIUM PHOSPHATE 4 MG/ML IJ SOLN
INTRAMUSCULAR | Status: DC | PRN
  Administered 2021-03-15: 6 mg via INTRAVENOUS

## 2021-03-15 MED ORDER — AMISULPRIDE (ANTIEMETIC) 5 MG/2ML IV SOLN: 10.0000 mg | Freq: Once | INTRAVENOUS | Status: DC | PRN

## 2021-03-15 MED ORDER — FENTANYL CITRATE (PF) 100 MCG/2ML IJ SOLN
INTRAMUSCULAR | Status: AC
  Filled 2021-03-15: qty 2

## 2021-03-15 MED ORDER — 0.9 % SODIUM CHLORIDE (POUR BTL) OPTIME
TOPICAL | Status: DC | PRN
  Administered 2021-03-15: 50 mL

## 2021-03-15 MED ORDER — PROMETHAZINE HCL 25 MG/ML IJ SOLN: 6.2500 mg | INTRAMUSCULAR | Status: DC | PRN

## 2021-03-15 MED ORDER — CEFAZOLIN SODIUM-DEXTROSE 2-4 GM/100ML-% IV SOLN
2.0000 g | INTRAVENOUS | Status: AC
  Administered 2021-03-15: 2 g via INTRAVENOUS

## 2021-03-15 MED ORDER — ONDANSETRON HCL 4 MG/2ML IJ SOLN
INTRAMUSCULAR | Status: AC
  Filled 2021-03-15: qty 2

## 2021-03-15 MED ORDER — CEFAZOLIN SODIUM-DEXTROSE 2-4 GM/100ML-% IV SOLN
INTRAVENOUS | Status: AC
  Filled 2021-03-15: qty 100

## 2021-03-15 MED ORDER — LACTATED RINGERS IV SOLN: INTRAVENOUS | Status: DC

## 2021-03-15 MED ORDER — ONDANSETRON HCL 4 MG/2ML IJ SOLN
INTRAMUSCULAR | Status: DC | PRN
  Administered 2021-03-15: 4 mg via INTRAVENOUS

## 2021-03-15 MED ORDER — DEXAMETHASONE SODIUM PHOSPHATE 10 MG/ML IJ SOLN
INTRAMUSCULAR | Status: AC
  Filled 2021-03-15: qty 1

## 2021-03-15 MED ORDER — BUPIVACAINE HCL (PF) 0.25 % IJ SOLN
INTRAMUSCULAR | Status: DC | PRN
  Administered 2021-03-15: 10 mL

## 2021-03-15 MED ORDER — LIDOCAINE 2% (20 MG/ML) 5 ML SYRINGE
INTRAMUSCULAR | Status: DC | PRN
  Administered 2021-03-15: 60 mg via INTRAVENOUS

## 2021-03-15 MED ORDER — PROPOFOL 10 MG/ML IV BOLUS
INTRAVENOUS | Status: AC
  Filled 2021-03-15: qty 20

## 2021-03-15 MED ORDER — OXYCODONE HCL 5 MG/5ML PO SOLN: 5.0000 mg | Freq: Once | ORAL | Status: DC | PRN

## 2021-03-15 MED ORDER — HYDROMORPHONE HCL 1 MG/ML IJ SOLN: 0.2500 mg | INTRAMUSCULAR | Status: DC | PRN

## 2021-03-15 MED ORDER — PROPOFOL 10 MG/ML IV BOLUS
INTRAVENOUS | Status: DC | PRN
  Administered 2021-03-15: 150 mg via INTRAVENOUS

## 2021-03-15 SURGICAL SUPPLY — 32 items
APL PRP STRL LF DISP 70% ISPRP (MISCELLANEOUS) ×1
BLADE SURG 15 STRL LF DISP TIS (BLADE) ×1 IMPLANT
BLADE SURG 15 STRL SS (BLADE) ×2
BNDG CMPR 9X4 STRL LF SNTH (GAUZE/BANDAGES/DRESSINGS) ×1
BNDG COHESIVE 2X5 TAN ST LF (GAUZE/BANDAGES/DRESSINGS) ×2 IMPLANT
BNDG ESMARK 4X9 LF (GAUZE/BANDAGES/DRESSINGS) ×1 IMPLANT
CHLORAPREP W/TINT 26 (MISCELLANEOUS) ×2 IMPLANT
CORD BIPOLAR FORCEPS 12FT (ELECTRODE) IMPLANT
COVER BACK TABLE 60X90IN (DRAPES) ×2 IMPLANT
COVER MAYO STAND STRL (DRAPES) ×2 IMPLANT
CUFF TOURN SGL QUICK 18X4 (TOURNIQUET CUFF) ×1 IMPLANT
DECANTER SPIKE VIAL GLASS SM (MISCELLANEOUS) IMPLANT
DRAPE EXTREMITY T 121X128X90 (DISPOSABLE) ×2 IMPLANT
DRAPE SURG 17X23 STRL (DRAPES) ×2 IMPLANT
GAUZE SPONGE 4X4 12PLY STRL (GAUZE/BANDAGES/DRESSINGS) ×2 IMPLANT
GAUZE XEROFORM 1X8 LF (GAUZE/BANDAGES/DRESSINGS) ×2 IMPLANT
GLOVE SURG ORTHO LTX SZ8 (GLOVE) ×2 IMPLANT
GLOVE SURG POLYISO LF SZ6.5 (GLOVE) ×1 IMPLANT
GLOVE SURG UNDER POLY LF SZ8.5 (GLOVE) ×2 IMPLANT
GOWN STRL REUS W/ TWL LRG LVL3 (GOWN DISPOSABLE) ×1 IMPLANT
GOWN STRL REUS W/TWL LRG LVL3 (GOWN DISPOSABLE) ×2
GOWN STRL REUS W/TWL XL LVL3 (GOWN DISPOSABLE) ×2 IMPLANT
NDL PRECISIONGLIDE 27X1.5 (NEEDLE) ×1 IMPLANT
NEEDLE PRECISIONGLIDE 27X1.5 (NEEDLE) ×2 IMPLANT
NS IRRIG 1000ML POUR BTL (IV SOLUTION) ×2 IMPLANT
PACK BASIN DAY SURGERY FS (CUSTOM PROCEDURE TRAY) ×2 IMPLANT
STOCKINETTE 4X48 STRL (DRAPES) ×2 IMPLANT
SUT ETHILON 4 0 PS 2 18 (SUTURE) ×2 IMPLANT
SYR BULB EAR ULCER 3OZ GRN STR (SYRINGE) ×2 IMPLANT
SYR CONTROL 10ML LL (SYRINGE) ×2 IMPLANT
TOWEL GREEN STERILE FF (TOWEL DISPOSABLE) ×4 IMPLANT
UNDERPAD 30X36 HEAVY ABSORB (UNDERPADS AND DIAPERS) ×2 IMPLANT

## 2021-03-15 NOTE — Transfer of Care (Signed)
Immediate Anesthesia Transfer of Care Note  Patient: Ruth Murray  Procedure(s) Performed: RELEASE TRIGGER FINGER/A-1 PULLEY RIGHT MIDDLE FINGER AND RIGHT RING FINGER (Right: Hand)  Patient Location: PACU  Anesthesia Type:General  Level of Consciousness: drowsy  Airway & Oxygen Therapy: Patient Spontanous Breathing and Patient connected to face mask oxygen  Post-op Assessment: Report given to RN and Post -op Vital signs reviewed and stable  Post vital signs: Reviewed and stable  Last Vitals:  Vitals Value Taken Time  BP    Temp    Pulse 48 03/15/21 1002  Resp 8 03/15/21 1002  SpO2 100 % 03/15/21 1002  Vitals shown include unvalidated device data.  Last Pain:  Vitals:   03/07/21 1516  TempSrc: Oral  PainSc: 0-No pain      Patients Stated Pain Goal: 3 (60/63/01 6010)  Complications: No notable events documented.

## 2021-03-15 NOTE — Op Note (Signed)
NAME: Ruth Murray MEDICAL RECORD NO: [id number] DATE OF BIRTH: Apr 19, 1941 FACILITY: [facility] LOCATION: [facility] PHYSICIAN: Wynonia Sours, MD   OPERATIVE REPORT   DATE OF PROCEDURE: 03/15/21    PREOPERATIVE DIAGNOSIS: Stenosing tenosynovitis right middle right ring fingers   POSTOPERATIVE DIAGNOSIS: Same   PROCEDURE: Release A1 pulley right middle right ring finger   SURGEON: Daryll Brod, M.D.   ASSISTANT: none   ANESTHESIA:  General and Local   INTRAVENOUS FLUIDS:  Per anesthesia flow sheet.   ESTIMATED BLOOD LOSS:  Minimal.   COMPLICATIONS:  None.   SPECIMENS:  none   TOURNIQUET TIME:    Total Tourniquet Time Documented: Upper Arm (Right) - 14 minutes Total: Upper Arm (Right) - 14 minutes    DISPOSITION:  Stable to PACU.   INDICATIONS: Patient is a 80 year old female with a history of triggering of her middle and right ring fingers.  This is not responded to conservative treatment she is elected undergo surgical release of the A1 pulleys on those 2 digits.  Preperi-and postoperative course been discussed along with risk complications.  She is aware that there is no guarantee to the surgery the possibility of infection recurrence injury to arteries nerves tendons complete relief symptoms and dystrophy.  Preoperative.  The patient is seen extremity marked by both patient and surgeon antibiotic given  OPERATIVE COURSE: Patient brought the operating room placed in supine position with the right arm free.  General anesthetic was carried out without difficulty under the direction the anesthesia department.  Prep was done with ChloraPrep a 3-minute dry time allowed timeout taken confirm patient procedure.  The limb was exsanguinated with an Esmarch bandage turn placed on the arm was inflated to 250 mmHg.  An oblique incision was made over the A1 pulley of the middle finger.  Carried down through subcutaneous tissue.  Bleeders were electrocauterized with  bipolar.  Retractors were placed retracting the neurovascular bundles radially and ulnarly.  The A1 pulley was found identified found to be thickened this was released on its radial aspect a small incision was made centrally and A2.  Tenosynovial tissue proximally was separated with blunt dissection using retractors.  The finger placed full passive range of motion no further triggering was noted.  The wound was irrigated with saline.  A separate incision was then made over the ring finger obliquely.  This was again carried down through subcutaneous tissue.  Retractors were placed retracting the neurovascular bundles radially and ulnarly.  The A1 pulley was identified this was released on its radial aspect a small incision made centrally and A2 the 2 tendons were then separated with blunt dissection dissection using retractors.  The finger was placed through full passive range of motion no further triggering was noted.  Wounds were copiously irrigated with saline and each was closed with interrupted 4 nylon sutures.  Local infiltration with quarter percent bupivacaine without epinephrine was given approximately 10 cc was used.  A sterile compressive dressing with the fingers free was applied.  Deflation of the tourniquet all fingers immediately pink.  She was taken to the recovery room for observation in satisfactory condition.  She will be discharged home to return to the hand center [facility] in 1 week and Tylenol ibuprofen for pain with Ultram for breakthrough.   Daryll Brod, MD Electronically signed, 03/15/21

## 2021-03-15 NOTE — Anesthesia Procedure Notes (Signed)
Procedure Name: LMA Insertion Date/Time: 03/15/2021 9:31 AM Performed by: Ezequiel Kayser, CRNA Pre-anesthesia Checklist: Patient identified, Emergency Drugs available, Suction available and Patient being monitored Patient Re-evaluated:Patient Re-evaluated prior to induction Oxygen Delivery Method: Circle System Utilized Preoxygenation: Pre-oxygenation with 100% oxygen Induction Type: IV induction Ventilation: Mask ventilation without difficulty LMA: LMA inserted LMA Size: 4.0 Number of attempts: 1 Airway Equipment and Method: Bite block Placement Confirmation: positive ETCO2 Tube secured with: Tape Dental Injury: Teeth and Oropharynx as per pre-operative assessment

## 2021-03-15 NOTE — Brief Op Note (Signed)
03/15/2021  10:00 AM  PATIENT:  Ruth Murray  80 y.o. female  PRE-OPERATIVE DIAGNOSIS:  RELEASE A-1 PULLEY RIGHT MIDDLE FINGER AND RIGHT RING FINGER  POST-OPERATIVE DIAGNOSIS:  RELEASE A-1 PULLEY RIGHT MIDDLE FINGER AND RIGHT RING FINGER  PROCEDURE:  Procedure(s): RELEASE TRIGGER FINGER/A-1 PULLEY RIGHT MIDDLE FINGER AND RIGHT RING FINGER (Right)  SURGEON:  Surgeon(s) and Role:    Daryll Brod, MD - Primary  PHYSICIAN ASSISTANT:   ASSISTANTS: none   ANESTHESIA:   local and general  EBL:  2 mL   BLOOD ADMINISTERED:none  DRAINS: none   LOCAL MEDICATIONS USED:  BUPIVICAINE   SPECIMEN:  No Specimen  DISPOSITION OF SPECIMEN:  N/A  COUNTS:  YES  TOURNIQUET:   Total Tourniquet Time Documented: Upper Arm (Right) - 14 minutes Total: Upper Arm (Right) - 14 minutes   DICTATION: .Viviann Spare Dictation  PLAN OF CARE: Discharge to home after PACU  PATIENT DISPOSITION:  PACU - hemodynamically stable.

## 2021-03-15 NOTE — H&P (Signed)
Ruth Murray is an 80 y.o. female.   Chief Complaint: catching right middle and ring fingers HPI: Ruth Murray is a 80 yo female with bilateral hand pain .she is complaining of triggering of the right ring and right middle fingers and difficulty gripping on her left side. She was last seen on 09/13/2020. The right ring finger has been injected on 2 occasions. She continues complain of triggering of the ring finger especially in the morning she states the middle fingers following soup. He complains of pain at the metacarpal phalangeal joint volar aspect. She is also complaining of her left hand with decreased grip. She complains of a generalized aching type pain. This is located on her hand and her palm. She is not complaining of any numbness or tingling   Past Medical History:  Diagnosis Date   Arthritis    Atypical nevus 06/12/2012   mild Right nose   Basal cell carcinoma    Clark level II melanoma ([place]) 10/15/2018   right side of nose   Hyperlipidemia    Hypertension    Lentigo maligna ([place]) 07/26/1999   right front thigh--tx excision    Past Surgical History:  Procedure Laterality Date   DEBRIDEMENT TENNIS ELBOW  1982   right   GANGLION CYST EXCISION  2010   wrist   TONSILLECTOMY  1965    Family History  Problem Relation Age of Onset   Colon cancer Neg Hx    Stomach cancer Neg Hx    Esophageal cancer Neg Hx    Rectal cancer Neg Hx    Social History:  reports that she quit smoking about 49 years ago. Her smoking use included cigarettes. She has never used smokeless tobacco. She reports current alcohol use of about 3.0 standard drinks per week. She reports that she does not use drugs.  Allergies:  Allergies  Allergen Reactions   Codeine Nausea Only and Nausea And Vomiting   Sulfa Antibiotics    Tape Other (See Comments)    Other reaction(s): Other Skin gets burns from the tape     No medications prior to admission.    No results found for this or any  previous visit (from the past 48 hour(s)).  No results found.   Pertinent items are noted in HPI.  Height 5\' 6"  (1.676 m), weight 65.3 kg.  General appearance: alert, cooperative, and appears stated age Head: Normocephalic, without obvious abnormality Neck: no JVD Resp: clear to auscultation bilaterally Cardio: regular rate and rhythm, S1, S2 normal, no murmur, click, rub or gallop GI: soft, non-tender; bowel sounds normal; no masses,  no organomegaly Extremities:  Catching middle and ring fingers right hand Pulses: 2+ and symmetric Skin: Skin color, texture, turgor normal. No rashes or lesions Neurologic: Grossly normal Incision/Wound:  na  Assessment/Plan Diagnosis triggering right middle right ring fingers  Plan: We have discussed surgical release of the A1 pulley of the ring finger and would also add the middle finger and that this beginning to bother her. She would like to proceed. Prepare and postoperative course been discussed along with risk and complications. She is aware that there is no guarantee to the surgery the possibility of infection recurrence injury to arteries nerves tendons incomplete relief symptoms and dystrophy. We will have her placed in both hard and soft hand-based CMC splints to the left side. She is scheduled for release A1 pulley right middle right ring finger as an outpatient under regional anesthesia. Daryll Brod 03/15/2021, 5:24 AM

## 2021-03-15 NOTE — Anesthesia Preprocedure Evaluation (Signed)
Anesthesia Evaluation  Patient identified by MRN, date of birth, ID band Patient awake    Reviewed: Allergy & Precautions, NPO status , Patient's Chart, lab work & pertinent test results  Airway Mallampati: II  TM Distance: >3 FB Neck ROM: Full    Dental no notable dental hx.    Pulmonary neg pulmonary ROS, former smoker,    Pulmonary exam normal breath sounds clear to auscultation       Cardiovascular hypertension, Pt. on medications negative cardio ROS Normal cardiovascular exam Rhythm:Regular Rate:Normal     Neuro/Psych negative neurological ROS  negative psych ROS   GI/Hepatic negative GI ROS, Neg liver ROS,   Endo/Other  negative endocrine ROS  Renal/GU negative Renal ROS  negative genitourinary   Musculoskeletal  (+) Arthritis , Osteoarthritis,    Abdominal   Peds negative pediatric ROS (+)  Hematology negative hematology ROS (+)   Anesthesia Other Findings   Reproductive/Obstetrics negative OB ROS                             Anesthesia Physical Anesthesia Plan  ASA: 2  Anesthesia Plan: General   Post-op Pain Management:    Induction: Intravenous  PONV Risk Score and Plan: 3 and Ondansetron, Dexamethasone, Midazolam and Treatment may vary due to age or medical condition  Airway Management Planned: LMA  Additional Equipment:   Intra-op Plan:   Post-operative Plan: Extubation in OR  Informed Consent: I have reviewed the patients History and Physical, chart, labs and discussed the procedure including the risks, benefits and alternatives for the proposed anesthesia with the patient or authorized representative who has indicated his/her understanding and acceptance.     Dental advisory given  Plan Discussed with: CRNA  Anesthesia Plan Comments:         Anesthesia Quick Evaluation

## 2021-03-15 NOTE — Anesthesia Postprocedure Evaluation (Signed)
Anesthesia Post Note  Patient: Ruth Murray  Procedure(s) Performed: RELEASE TRIGGER FINGER/A-1 PULLEY RIGHT MIDDLE FINGER AND RIGHT RING FINGER (Right: Hand)     Patient location during evaluation: PACU Anesthesia Type: General Level of consciousness: awake and alert Pain management: pain level controlled Vital Signs Assessment: post-procedure vital signs reviewed and stable Respiratory status: spontaneous breathing, nonlabored ventilation and respiratory function stable Cardiovascular status: blood pressure returned to baseline and stable Postop Assessment: no apparent nausea or vomiting Anesthetic complications: no   No notable events documented.  Last Vitals:  Vitals:   03/15/21 1030 03/15/21 1056  BP: (!) 148/63 (!) 149/70  Pulse: (!) 55 69  Resp: 11 12  Temp:  36.8 C  SpO2: 97% 96%    Last Pain:  Vitals:   03/15/21 1056  TempSrc:   PainSc: 0-No pain                 Lynda Rainwater

## 2021-03-15 NOTE — Discharge Instructions (Addendum)

## 2021-03-18 ENCOUNTER — Encounter (HOSPITAL_BASED_OUTPATIENT_CLINIC_OR_DEPARTMENT_OTHER): Payer: Self-pay | Admitting: Orthopedic Surgery

## 2021-03-18 DIAGNOSIS — H30023 Focal chorioretinal inflammation of posterior pole, bilateral: Secondary | ICD-10-CM | POA: Diagnosis not present

## 2021-03-18 DIAGNOSIS — H35361 Drusen (degenerative) of macula, right eye: Secondary | ICD-10-CM | POA: Diagnosis not present

## 2021-03-18 DIAGNOSIS — H31093 Other chorioretinal scars, bilateral: Secondary | ICD-10-CM | POA: Diagnosis not present

## 2021-03-24 DIAGNOSIS — Z23 Encounter for immunization: Secondary | ICD-10-CM | POA: Diagnosis not present

## 2021-03-25 DIAGNOSIS — H30021 Focal chorioretinal inflammation of posterior pole, right eye: Secondary | ICD-10-CM | POA: Diagnosis not present

## 2021-03-30 ENCOUNTER — Encounter: Payer: Self-pay | Admitting: Dermatology

## 2021-03-30 ENCOUNTER — Ambulatory Visit (INDEPENDENT_AMBULATORY_CARE_PROVIDER_SITE_OTHER): Payer: Medicare Other | Admitting: Dermatology

## 2021-03-30 ENCOUNTER — Other Ambulatory Visit: Payer: Self-pay

## 2021-03-30 DIAGNOSIS — Z85828 Personal history of other malignant neoplasm of skin: Secondary | ICD-10-CM | POA: Diagnosis not present

## 2021-03-30 DIAGNOSIS — Z86018 Personal history of other benign neoplasm: Secondary | ICD-10-CM

## 2021-03-30 DIAGNOSIS — B079 Viral wart, unspecified: Secondary | ICD-10-CM

## 2021-03-30 DIAGNOSIS — Z85820 Personal history of malignant melanoma of skin: Secondary | ICD-10-CM

## 2021-03-30 DIAGNOSIS — Z1283 Encounter for screening for malignant neoplasm of skin: Secondary | ICD-10-CM | POA: Diagnosis not present

## 2021-04-15 ENCOUNTER — Encounter: Payer: Self-pay | Admitting: Dermatology

## 2021-04-15 NOTE — Progress Notes (Signed)
   Follow-Up Visit   Subjective  Ruth Murray is a 80 y.o. female who presents for the following: Annual Exam (Yearly skin check history of bcc  and atypia and mm ).  General skin examination Location:  Duration:  Quality:  Associated Signs/Symptoms: Modifying Factors:  Severity:  Timing: Context:   Objective  Well appearing patient in no apparent distress; mood and affect are within normal limits. Torso - Posterior (Back) General skin examination: No atypical nevi or signs of NMSC noted at the time of the visit.  No recurrent skin cancer.  Left Foot - Anterior Verrucous 3 mm papule    A full examination was performed including scalp, head, eyes, ears, nose, lips, neck, chest, axillae, abdomen, back, buttocks, bilateral upper extremities, bilateral lower extremities, hands, feet, fingers, toes, fingernails, and toenails. All findings within normal limits unless otherwise noted below.  Areas beneath undergarments not fully examined   Assessment & Plan    Skin exam for malignant neoplasm Torso - Posterior (Back)  Yearly skin check.   Viral warts, unspecified type Left Foot - Anterior  No intervention initiated      I, [name], MD, have reviewed all documentation for this visit.  The documentation on 04/15/21 for the exam, diagnosis, procedures, and orders are all accurate and complete.

## 2021-05-04 DIAGNOSIS — M1990 Unspecified osteoarthritis, unspecified site: Secondary | ICD-10-CM | POA: Diagnosis not present

## 2021-05-04 DIAGNOSIS — I10 Essential (primary) hypertension: Secondary | ICD-10-CM | POA: Diagnosis not present

## 2021-06-20 DIAGNOSIS — I10 Essential (primary) hypertension: Secondary | ICD-10-CM | POA: Diagnosis not present

## 2021-06-20 DIAGNOSIS — G2581 Restless legs syndrome: Secondary | ICD-10-CM | POA: Diagnosis not present

## 2021-06-24 DIAGNOSIS — H353131 Nonexudative age-related macular degeneration, bilateral, early dry stage: Secondary | ICD-10-CM | POA: Diagnosis not present

## 2021-06-24 DIAGNOSIS — H3581 Retinal edema: Secondary | ICD-10-CM | POA: Diagnosis not present

## 2021-06-24 DIAGNOSIS — H30042 Focal chorioretinal inflammation, macular or paramacular, left eye: Secondary | ICD-10-CM | POA: Diagnosis not present

## 2021-06-24 DIAGNOSIS — H31093 Other chorioretinal scars, bilateral: Secondary | ICD-10-CM | POA: Diagnosis not present

## 2021-07-01 DIAGNOSIS — H30042 Focal chorioretinal inflammation, macular or paramacular, left eye: Secondary | ICD-10-CM | POA: Diagnosis not present

## 2021-07-22 DIAGNOSIS — H353131 Nonexudative age-related macular degeneration, bilateral, early dry stage: Secondary | ICD-10-CM | POA: Diagnosis not present

## 2021-07-22 DIAGNOSIS — H30042 Focal chorioretinal inflammation, macular or paramacular, left eye: Secondary | ICD-10-CM | POA: Diagnosis not present

## 2021-07-22 DIAGNOSIS — H3581 Retinal edema: Secondary | ICD-10-CM | POA: Diagnosis not present

## 2021-07-22 DIAGNOSIS — H31093 Other chorioretinal scars, bilateral: Secondary | ICD-10-CM | POA: Diagnosis not present

## 2021-07-28 DIAGNOSIS — H903 Sensorineural hearing loss, bilateral: Secondary | ICD-10-CM | POA: Diagnosis not present

## 2021-07-28 DIAGNOSIS — H6123 Impacted cerumen, bilateral: Secondary | ICD-10-CM | POA: Diagnosis not present

## 2021-08-29 DIAGNOSIS — Z6823 Body mass index (BMI) 23.0-23.9, adult: Secondary | ICD-10-CM | POA: Diagnosis not present

## 2021-09-20 DIAGNOSIS — K591 Functional diarrhea: Secondary | ICD-10-CM | POA: Diagnosis not present

## 2021-09-20 DIAGNOSIS — R197 Diarrhea, unspecified: Secondary | ICD-10-CM | POA: Diagnosis not present

## 2021-09-23 DIAGNOSIS — H31093 Other chorioretinal scars, bilateral: Secondary | ICD-10-CM | POA: Diagnosis not present

## 2021-09-23 DIAGNOSIS — H30042 Focal chorioretinal inflammation, macular or paramacular, left eye: Secondary | ICD-10-CM | POA: Diagnosis not present

## 2021-09-23 DIAGNOSIS — H353131 Nonexudative age-related macular degeneration, bilateral, early dry stage: Secondary | ICD-10-CM | POA: Diagnosis not present

## 2021-09-23 DIAGNOSIS — H3581 Retinal edema: Secondary | ICD-10-CM | POA: Diagnosis not present

## 2021-10-06 ENCOUNTER — Encounter: Payer: Self-pay | Admitting: Physician Assistant

## 2021-10-06 DIAGNOSIS — R197 Diarrhea, unspecified: Secondary | ICD-10-CM | POA: Diagnosis not present

## 2021-10-14 DIAGNOSIS — H6123 Impacted cerumen, bilateral: Secondary | ICD-10-CM | POA: Diagnosis not present

## 2021-11-01 ENCOUNTER — Encounter: Payer: Self-pay | Admitting: Physician Assistant

## 2021-11-01 ENCOUNTER — Ambulatory Visit: Payer: Medicare Other | Admitting: Physician Assistant

## 2021-11-21 ENCOUNTER — Other Ambulatory Visit: Payer: Self-pay | Admitting: Internal Medicine

## 2021-11-21 DIAGNOSIS — Z1231 Encounter for screening mammogram for malignant neoplasm of breast: Secondary | ICD-10-CM

## 2021-11-30 ENCOUNTER — Encounter: Payer: Self-pay | Admitting: Physician Assistant

## 2021-11-30 ENCOUNTER — Telehealth: Payer: Self-pay

## 2021-11-30 ENCOUNTER — Ambulatory Visit (INDEPENDENT_AMBULATORY_CARE_PROVIDER_SITE_OTHER): Payer: Medicare Other | Admitting: Physician Assistant

## 2021-11-30 VITALS — BP 152/60 | HR 55 | Ht 66.0 in | Wt 149.0 lb

## 2021-11-30 DIAGNOSIS — R194 Change in bowel habit: Secondary | ICD-10-CM | POA: Diagnosis not present

## 2021-11-30 NOTE — Addendum Note (Signed)
Addended by: Darrall Dears on: 11/30/2021 01:28 PM   Modules accepted: Orders

## 2021-11-30 NOTE — Progress Notes (Addendum)
Chief Complaint: Diarrhea  HPI:    Ruth Murray is an 81 year old female with a past medical history as listed below, known to Dr. Olevia Perches previously, who was referred to me by Asencion Noble, MD for a complaint of diarrhea.      02/09/2012 colonoscopy was normal with minimal sigmoid diverticulosis.  Repeat recommended in 10 years if stable.    10/06/21 CBC, TSH and ESR normal.    Today, the patient presents to clinic and tells me that for several months she has been having looser stools than normal typically in the morning 2-3 times and then no more the rest of the day.  She tells me this seems to come and go but her stool is always loose or soft and never firm and formed.  She has been on Magnesium supplementation for at least 2 years, but actually ran out about a week ago and has not used it and has noticed no change in her bowel movements.  Also saw her PCP who did stool studies and blood test for her which were normal.  (We do not have stool studies).  Also tried stopping dairy as well as leafy greens for about a week but this made no difference.  Tells me she has had no big changes in her diet.  Denies any abdominal pain.  She does use Imodium as needed if she needs to go somewhere in the morning to prevent problems.  This works for her.    Denies fever, chills, weight loss, blood in her stool or symptoms that awaken her from sleep.  Past Medical History:  Diagnosis Date   Arthritis    Atypical nevus 06/12/2012   mild Right nose   Basal cell carcinoma    Clark level II melanoma ([place]) 10/15/2018   right side of nose   Hyperlipidemia    Hypertension    Lentigo maligna ([place]) 07/26/1999   right front thigh--tx excision    Past Surgical History:  Procedure Laterality Date   DEBRIDEMENT TENNIS ELBOW  1982   right   GANGLION CYST EXCISION  2010   wrist   TONSILLECTOMY  1965   TRIGGER FINGER RELEASE Right 03/15/2021   Procedure: RELEASE TRIGGER FINGER/A-1 PULLEY RIGHT MIDDLE FINGER AND RIGHT  RING FINGER;  Surgeon: Daryll Brod, MD;  Location: [facility];  Service: Orthopedics;  Laterality: Right;    Current Outpatient Medications  Medication Sig Dispense Refill   allopurinol (ZYLOPRIM) 300 MG tablet allopurinol 300 mg tablet     amLODipine (NORVASC) 5 MG tablet amlodipine 5 mg tablet     atenolol (TENORMIN) 25 MG tablet atenolol 25 mg tablet     atorvastatin (LIPITOR) 10 MG tablet atorvastatin 10 mg tablet     calcium carbonate 1250 MG capsule Take by mouth.     Calcium Carbonate-Vitamin D (CALCIUM + D PO) Take by mouth daily.     diazepam (VALIUM) 5 MG tablet 1 PO 1 HR PRIOR TO MRI, I PO 30 MIN PRIOR TO MRI IF NEEDED 2 tablet 0   Magnesium 100 MG CAPS magnesium     Multiple Vitamin (MULTIVITAMIN) tablet Take 1 tablet by mouth daily.     pyridOXINE (VITAMIN B-6) 100 MG tablet Take 100 mg by mouth daily.     traMADol (ULTRAM) 50 MG tablet Take 1 tablet (50 mg total) by mouth every 6 (six) hours as needed. 20 tablet 0   vitamin B-12 (CYANOCOBALAMIN) 100 MCG tablet Take 100 mcg by mouth daily.  vitamin E 1000 UNIT capsule Take 1,000 Units by mouth daily.     No current facility-administered medications for this visit.    Allergies as of 11/30/2021 - Review Complete 04/15/2021  Allergen Reaction Noted   Codeine Nausea Only and Nausea And Vomiting 08/05/2013   Sulfa antibiotics  09/05/2017   Tape Other (See Comments) 05/05/2014    Family History  Problem Relation Age of Onset   Colon cancer Neg Hx    Stomach cancer Neg Hx    Esophageal cancer Neg Hx    Rectal cancer Neg Hx     Social History   Socioeconomic History   Marital status: Married    Spouse name: Not on file   Number of children: Not on file   Years of education: Not on file   Highest education level: Not on file  Occupational History   Not on file  Tobacco Use   Smoking status: Former    Types: Cigarettes    Quit date: 01/26/1972    Years since quitting: 49.8   Smokeless tobacco:  Never  Vaping Use   Vaping Use: Never used  Substance and Sexual Activity   Alcohol use: Yes    Alcohol/week: 3.0 standard drinks of alcohol    Types: 3 Standard drinks or equivalent per week    Comment: rare   Drug use: Never   Sexual activity: Not on file  Other Topics Concern   Not on file  Social History Narrative   Not on file   Social Determinants of Health   Financial Resource Strain: Not on file  Food Insecurity: Not on file  Transportation Needs: Not on file  Physical Activity: Not on file  Stress: Not on file  Social Connections: Not on file  Intimate Partner Violence: Not on file    Review of Systems:    Constitutional: No weight loss, fever or chills Skin: No rash  Cardiovascular: No chest pain Respiratory: No SOB  Gastrointestinal: See HPI and otherwise negative Genitourinary: No dysuria  Neurological: No headache, dizziness or syncope Musculoskeletal: No new muscle or joint pain Hematologic: No bleeding  Psychiatric: No history of depression or anxiety   Physical Exam:  Vital signs: BP (!) 152/60   Pulse (!) 55   Ht 5' 6" (1.676 m)   Wt 149 lb (67.6 kg)   BMI 24.05 kg/m    Constitutional:   Pleasant Elderly Caucasian female appears to be in NAD, Well developed, Well nourished, alert and cooperative Head:  Normocephalic and atraumatic. Eyes:   PEERL, EOMI. No icterus. Conjunctiva pink. Ears:  Normal auditory acuity. Neck:  Supple Throat: Oral cavity and pharynx without inflammation, swelling or lesion.  Respiratory: Respirations even and unlabored. Lungs clear to auscultation bilaterally.   No wheezes, crackles, or rhonchi.  Cardiovascular: Normal S1, S2. No MRG. Regular rate and rhythm. No peripheral edema, cyanosis or pallor.  Gastrointestinal:  Soft, nondistended, nontender. No rebound or guarding. Normal bowel sounds. No appreciable masses or hepatomegaly. Rectal:  Not performed.  Msk:  Symmetrical without gross deformities. Without edema, no  deformity or joint abnormality.  Neurologic:  Alert and  oriented x4;  grossly normal neurologically.  Skin:   Dry and intact without significant lesions or rashes. Psychiatric: Demonstrates good judgement and reason without abnormal affect or behaviors.  See HPI for recent labs.  Assessment: 1.  Change in bowel habits: Towards looser softer stool over the past 3 to 4 months, apparently stool studies done by PCP which we do  not have her normal, CBC, ESR and TSH normal, typically 2-3 looser stools in the morning and then on the rest of the day, Imodium does help when taken; consider IBS versus age versus microscopic colitis versus inflammatory causes  Plan: 1.  Discussed with patient that we will request recent stool studies, if there are any that we need additionally I will let her know and she can bring in a sample. 2.  For now would hold her magnesium for another week to see if this makes any difference. 3.  Recommend she start a fiber supplement such as Citrucel, Metamucil or Benefiber once daily 4.  Also start Align probiotic once daily for the next month 5.  Continue to use Imodium as needed 6.  Patient to follow in clinic with me in 1 to 2 months.  At that time if she continues with her abnormal bowel habits would discuss a colonoscopy for further evaluation. She was assigned to Dr Tarri Glenn this morning.  Ellouise Newer, PA-C [facility] 11/30/2021, 10:30 AM  Cc: Asencion Noble, MD   Addendum: 12:42 PM 11/30/2021  Received patient's stool studies, she previously completed a GI profile stool PCR and it was negative.  Will add on a fecal lactoferrin, calprotectin and fecal pancreatic elastase.  I will have my nurse call the patient.  Ellouise Newer, PA-C

## 2021-11-30 NOTE — Patient Instructions (Signed)
If you are age 80 or older, your body mass index should be between 23-30. Your Body mass index is 24.05 kg/m. If this is out of the aforementioned range listed, please consider follow up with your Primary Care Provider.  If you are age 3 or younger, your body mass index should be between 19-25. Your Body mass index is 24.05 kg/m. If this is out of the aformentioned range listed, please consider follow up with your Primary Care Provider.   Start Align once daily.   Start a daily fiber supplement such as Benefiber , Citrucel , or metamucil.   Continue imodium as needed.   The [facility] providers would like to encourage you to use [facility] to communicate with providers for non-urgent requests or questions.  Due to long hold times on the telephone, sending your provider a message by [facility] may be a faster and more efficient way to get a response.  Please allow 48 business hours for a response.  Please remember that this is for non-urgent requests.   It was a pleasure to see you today!  Thank you for trusting me with your gastrointestinal care!    Ellouise Newer, PA-C

## 2021-11-30 NOTE — Telephone Encounter (Signed)
Left VM letting patient know to come to the lab and pick up stool studies kits.

## 2021-12-09 NOTE — Progress Notes (Signed)
Reviewed and agree with management plans. ? ?Derrall Hicks L. Noble Bodie, MD, MPH  ?

## 2021-12-12 ENCOUNTER — Other Ambulatory Visit: Payer: Self-pay

## 2021-12-12 DIAGNOSIS — R194 Change in bowel habit: Secondary | ICD-10-CM

## 2021-12-12 DIAGNOSIS — Z79899 Other long term (current) drug therapy: Secondary | ICD-10-CM | POA: Diagnosis not present

## 2021-12-12 DIAGNOSIS — G2581 Restless legs syndrome: Secondary | ICD-10-CM | POA: Diagnosis not present

## 2021-12-12 DIAGNOSIS — I10 Essential (primary) hypertension: Secondary | ICD-10-CM | POA: Diagnosis not present

## 2021-12-12 NOTE — Telephone Encounter (Signed)
Patient called back, I advised her that she needed to come to the office and pick up the stool studies kit. Patient stated that she had already done the test with Dr. Willey Blade. I advised patient that the stool test that Anderson Malta had order was a different test than what Dr. Willey Blade had ordered for her. Patient stated that it was ridiculous that she has to drive all the way here to pick up something she had already done. Patient is seeking advice if there is anyway the stool kits can be sent  somewhere close to her so she does not have to drive to [place]. Please advise.

## 2021-12-12 NOTE — Telephone Encounter (Signed)
Left VM letting patient know that orders have been sent to [facility] in [place].

## 2021-12-19 DIAGNOSIS — E785 Hyperlipidemia, unspecified: Secondary | ICD-10-CM | POA: Diagnosis not present

## 2021-12-19 DIAGNOSIS — I10 Essential (primary) hypertension: Secondary | ICD-10-CM | POA: Diagnosis not present

## 2021-12-19 DIAGNOSIS — G2581 Restless legs syndrome: Secondary | ICD-10-CM | POA: Diagnosis not present

## 2021-12-19 DIAGNOSIS — R197 Diarrhea, unspecified: Secondary | ICD-10-CM | POA: Diagnosis not present

## 2021-12-19 DIAGNOSIS — M109 Gout, unspecified: Secondary | ICD-10-CM | POA: Diagnosis not present

## 2021-12-22 ENCOUNTER — Telehealth: Payer: Self-pay | Admitting: Physician Assistant

## 2021-12-22 NOTE — Telephone Encounter (Signed)
Received a call from Dr. Knute Neu office requesting to speak to you regarding patient's care.  Please give Dr. Willey Blade a call at [phone].  Thank you.

## 2021-12-22 NOTE — Telephone Encounter (Signed)
Telephone Call 12/22/2021 11:25 AM  Patient's PCP called to ask about a colonoscopy for microscopic colitis.  Discussed that plans were for additional stool studies first including lactoferrin, calprotectin and fecal pancreatic elastase.  Pending results from these, as per my note indicates,  we will discuss a colonoscopy for further evaluation of possible microscopic colitis.  He verbalized understanding and tells me that per his knowledge she is working on getting stool studies done this week.  Ellouise Newer, PA-C

## 2021-12-23 NOTE — Addendum Note (Signed)
Addended by: Darrall Dears on: 12/23/2021 01:33 PM   Modules accepted: Orders

## 2021-12-28 ENCOUNTER — Ambulatory Visit
Admission: RE | Admit: 2021-12-28 | Discharge: 2021-12-28 | Disposition: A | Discharge status: Home / Self Care | Payer: Medicare Other | Source: Ambulatory Visit | Attending: Internal Medicine | Admitting: Internal Medicine

## 2021-12-28 DIAGNOSIS — H353131 Nonexudative age-related macular degeneration, bilateral, early dry stage: Secondary | ICD-10-CM | POA: Diagnosis not present

## 2021-12-28 DIAGNOSIS — H30043 Focal chorioretinal inflammation, macular or paramacular, bilateral: Secondary | ICD-10-CM | POA: Diagnosis not present

## 2021-12-28 DIAGNOSIS — Z1231 Encounter for screening mammogram for malignant neoplasm of breast: Secondary | ICD-10-CM | POA: Diagnosis not present

## 2021-12-28 DIAGNOSIS — H31093 Other chorioretinal scars, bilateral: Secondary | ICD-10-CM | POA: Diagnosis not present

## 2021-12-28 DIAGNOSIS — H35353 Cystoid macular degeneration, bilateral: Secondary | ICD-10-CM | POA: Diagnosis not present

## 2021-12-29 ENCOUNTER — Other Ambulatory Visit: Payer: Self-pay | Admitting: Physician Assistant

## 2021-12-29 DIAGNOSIS — R194 Change in bowel habit: Secondary | ICD-10-CM | POA: Diagnosis not present

## 2021-12-31 LAB — CALPROTECTIN, FECAL: Calprotectin, Fecal: 38 ug/g (ref 0–120)

## 2021-12-31 LAB — SPECIMEN STATUS REPORT

## 2022-01-02 ENCOUNTER — Telehealth: Payer: Self-pay | Admitting: Physician Assistant

## 2022-01-02 ENCOUNTER — Other Ambulatory Visit: Payer: Self-pay

## 2022-01-02 LAB — PANCREATIC ELASTASE, FECAL: Pancreatic Elastase, Fecal: 109 ug Elast./g — ABNORMAL LOW (ref >200)

## 2022-01-02 LAB — SPECIMEN STATUS REPORT

## 2022-01-02 MED ORDER — PANCRELIPASE (LIP-PROT-AMYL) 36000-114000 UNITS PO CPEP: ORAL_CAPSULE | ORAL | 11 refills | Status: AC

## 2022-01-02 NOTE — Telephone Encounter (Signed)
Returned call to patient. We reviewed her stool study results and recommendations. See 12/29/21 stool study result note for details.

## 2022-01-03 LAB — SPECIMEN STATUS REPORT

## 2022-01-03 LAB — FECAL LACTOFERRIN, QUANT: Lactoferrin, Fecal, Quant.: 3.27 ug/mL(g) (ref 0.00–7.24)

## 2022-01-04 NOTE — Telephone Encounter (Signed)
Patient called seeking advice on Creon medication. She stated the pharmacy only have half dose of the medication and they would not let her pick it up. Please advise. Allowed to receive call on her Cell.

## 2022-01-04 NOTE — Telephone Encounter (Signed)
Called [facility] on file. I was informed that they only have 50 capsules of Creon to give the patient. The pharmacy is out of stock and they are not sure when they will be shipped another supply of Creon. I called the patient back and informed her of the information. I told pt that they partially filled the prescription. She stated that they would not let her pick up the partial prescription yesterday. I told the patient to try calling the pharmacy before she goes by. She has been advised to contact the pharmacy periodically to see if they have received a new shipment. Pt is aware that if it is a long time before they get Creon we may have to send in an alternative. Pt verbalized understanding and had no concerns at the end of the call.

## 2022-01-17 ENCOUNTER — Telehealth: Payer: Self-pay | Admitting: Physician Assistant

## 2022-01-17 NOTE — Telephone Encounter (Signed)
Called and spoke with patient. She states that when she filled her Creon prescription the pharmacy only gave her 50 capsules. She states that she was informed by the pharmacy that the medication was on backorder from the manufacturer. Pt was not sure how to proceed. Pt has not followed up with the pharmacy after this. She has been off of Creon for 1 days so far. I told pt that I will call the pharmacy and see if they have restocked Creon. If not, I will have to let Anderson Malta know and see what she would like to do. Pt verbalized understanding and had no concerns at the end of the call.  I called [facility] in [place] and spoke with [facility] (pharmacist). I was informed that patient filled her prescription on 8/2 and #50 was dispensed. Muhamed informed me that he has #100 available that he can fill for the patient today. He asked that the patient call in when her supply gets low. He states that he should get another shipment in soon and can hopefully get more medication.   I called patient again and informed her of the information provided by [facility]. The patient asked if she was supposed to notice a difference in symptoms by now. I told her that this can take a few weeks. Pt states that the medication can get expensive. I told pt that if it becomes too expensive to let us know and we can see if she will qualify for any assistance. Pt states that she will think about picking up the prescription. I told her that it will be available if she decides to pick it up. Pt verbalized understanding and had no concerns at the end of the call.

## 2022-01-17 NOTE — Telephone Encounter (Signed)
Patient called states  she was given a script for Creon however the pharmacy only provided her with a portion of the medication which is all they had so the patient is seeking advise.

## 2022-01-30 ENCOUNTER — Ambulatory Visit (INDEPENDENT_AMBULATORY_CARE_PROVIDER_SITE_OTHER): Payer: Medicare Other | Admitting: Physician Assistant

## 2022-01-30 ENCOUNTER — Other Ambulatory Visit (INDEPENDENT_AMBULATORY_CARE_PROVIDER_SITE_OTHER): Payer: Medicare Other

## 2022-01-30 ENCOUNTER — Encounter: Payer: Self-pay | Admitting: Physician Assistant

## 2022-01-30 VITALS — BP 140/74 | HR 60 | Ht 66.0 in | Wt 145.6 lb

## 2022-01-30 DIAGNOSIS — K8689 Other specified diseases of pancreas: Secondary | ICD-10-CM

## 2022-01-30 DIAGNOSIS — Z1211 Encounter for screening for malignant neoplasm of colon: Secondary | ICD-10-CM

## 2022-01-30 LAB — BASIC METABOLIC PANEL
BUN: 15 mg/dL (ref 6–23)
CO2: 29 mEq/L (ref 19–32)
Calcium: 10 mg/dL (ref 8.4–10.5)
Chloride: 100 mEq/L (ref 96–112)
Creatinine, Ser: 0.81 mg/dL (ref 0.40–1.20)
GFR: 68.61 mL/min (ref >60.00)
Glucose, Bld: 96 mg/dL (ref 70–99)
Potassium: 3.9 mEq/L (ref 3.5–5.1)
Sodium: 137 mEq/L (ref 135–145)

## 2022-01-30 MED ORDER — NA SULFATE-K SULFATE-MG SULF 17.5-3.13-1.6 GM/177ML PO SOLN: 1.0000 | Freq: Once | ORAL | 0 refills | Status: AC

## 2022-01-30 NOTE — Progress Notes (Signed)
Chief Complaint: Change in Bowel Habits  HPI:  Ruth Murray is an 81 year old female, known to Dr. Tarri Glenn, who was referred to me by Asencion Noble, MD for a complaint of change in bowel habits.     02/09/2012 colonoscopy normal with mild minimal sigmoid diverticulosis.  Repeat recommended in 10 years.    11/30/2021 patient seen in clinic by me for diarrhea.  At that time patient described that she was having looser stools worse in the morning.  She had been on magnesium supplementation for 2 years and had run out for a week but no change in bowel movements.  Also tried stopping dairy with no change.  At that time recommend the patient hold her Magnesium.  Recommend she start a Fiber supplement and Align.  Explained that we have discussed a colonoscopy if she continues abnormal bowel habits.    12/29/2021 fecal pancreatic elastase returned abnormally low.  Patient was told to start on Creon.    Today, the patient tells me that when she takes the Creon 2 with a meal and 1 with a snack she has more formed stool.  Tells me she still has 2 bowel movements soon as she gets out of bed but instead of being straight water, now they have more form, and then fall apart in the toilet.  This is improved over the past 3 weeks with the use of Creon.  Denies history of pancreatitis or diabetes.  In general things are improved from previous.    Patient reminds me that she is due for her colonoscopy coming up shortly here for screening.    Denies fever, chills, weight loss, blood in her stool or symptoms that awaken her from sleep.  Past Medical History:  Diagnosis Date   Arthritis    Atypical nevus 06/12/2012   mild Right nose   Basal cell carcinoma    Clark level II melanoma ([place]) 10/15/2018   right side of nose   Hyperlipidemia    Hypertension    Lentigo maligna ([place]) 07/26/1999   right front thigh--tx excision    Past Surgical History:  Procedure Laterality Date   DEBRIDEMENT TENNIS ELBOW  06/05/1980    right   GANGLION CYST EXCISION  06/05/2008   wrist   REPLACEMENT TOTAL HIP W/  RESURFACING IMPLANTS Right    REPLACEMENT TOTAL KNEE     TONSILLECTOMY  06/06/1963   TRIGGER FINGER RELEASE Right 03/15/2021   Procedure: RELEASE TRIGGER FINGER/A-1 PULLEY RIGHT MIDDLE FINGER AND RIGHT RING FINGER;  Surgeon: Daryll Brod, MD;  Location: [facility];  Service: Orthopedics;  Laterality: Right;    Current Outpatient Medications  Medication Sig Dispense Refill   allopurinol (ZYLOPRIM) 300 MG tablet allopurinol 300 mg tablet     amLODipine (NORVASC) 5 MG tablet amlodipine 5 mg tablet     atenolol (TENORMIN) 25 MG tablet atenolol 25 mg tablet     atorvastatin (LIPITOR) 10 MG tablet atorvastatin 10 mg tablet     calcium carbonate 1250 MG capsule Take by mouth.     Calcium Carbonate-Vitamin D (CALCIUM + D PO) Take by mouth daily.     diazepam (VALIUM) 5 MG tablet 1 PO 1 HR PRIOR TO MRI, I PO 30 MIN PRIOR TO MRI IF NEEDED (Patient not taking: Reported on 11/30/2021) 2 tablet 0   lipase/protease/amylase (CREON) 36000 UNITS CPEP capsule Take 2 capsules (72,000 Units total) by mouth 3 (three) times daily with meals. May also take 1 capsule (36,000 Units total) as  needed (with snacks). 240 capsule 11   Magnesium 100 MG CAPS magnesium     Multiple Vitamin (MULTIVITAMIN) tablet Take 1 tablet by mouth daily.     pyridOXINE (VITAMIN B-6) 100 MG tablet Take 100 mg by mouth daily.     traMADol (ULTRAM) 50 MG tablet Take 1 tablet (50 mg total) by mouth every 6 (six) hours as needed. (Patient not taking: Reported on 11/30/2021) 20 tablet 0   vitamin B-12 (CYANOCOBALAMIN) 100 MCG tablet Take 100 mcg by mouth daily.     vitamin E 1000 UNIT capsule Take 1,000 Units by mouth daily.     No current facility-administered medications for this visit.    Allergies as of 01/30/2022 - Review Complete 11/30/2021  Allergen Reaction Noted   Codeine Nausea Only and Nausea And Vomiting 08/05/2013   Sulfa antibiotics   09/05/2017   Tape Other (See Comments) 05/05/2014    Family History  Problem Relation Age of Onset   Heart disease Mother    Colon cancer Neg Hx    Stomach cancer Neg Hx    Esophageal cancer Neg Hx    Rectal cancer Neg Hx     Social History   Socioeconomic History   Marital status: Married    Spouse name: Not on file   Number of children: 3   Years of education: Not on file   Highest education level: Not on file  Occupational History   Occupation: retired  Tobacco Use   Smoking status: Former    Types: Cigarettes    Quit date: 01/26/1972    Years since quitting: 50.0   Smokeless tobacco: Never  Vaping Use   Vaping Use: Never used  Substance and Sexual Activity   Alcohol use: Yes    Alcohol/week: 3.0 standard drinks of alcohol    Types: 3 Standard drinks or equivalent per week    Comment: a glass of wine at night   Drug use: Never   Sexual activity: Not on file  Other Topics Concern   Not on file  Social History Narrative   Not on file   Social Determinants of Health   Financial Resource Strain: Not on file  Food Insecurity: Not on file  Transportation Needs: Not on file  Physical Activity: Not on file  Stress: Not on file  Social Connections: Not on file  Intimate Partner Violence: Not on file    Review of Systems:    Constitutional: No weight loss, fever or chills Cardiovascular: No chest pain Respiratory: No SOB  Gastrointestinal: See HPI and otherwise negative   Physical Exam:  Vital signs: BP (!) 140/74   Pulse 60   Ht '5\' 6"'$  (1.676 m)   Wt 145 lb 9.6 oz (66 kg)   BMI 23.50 kg/m    Constitutional:   Pleasant Elderly Caucasian female appears to be in NAD, Well developed, Well nourished, alert and cooperative Respiratory: Respirations even and unlabored. Lungs clear to auscultation bilaterally.   No wheezes, crackles, or rhonchi.  Cardiovascular: Normal S1, S2. No MRG. Regular rate and rhythm. No peripheral edema, cyanosis or pallor.   Gastrointestinal:  Soft, nondistended, nontender. No rebound or guarding. Normal bowel sounds. No appreciable masses or hepatomegaly. Rectal:  Not performed.  Psychiatric: Oriented to person, place and time. Demonstrates good judgement and reason without abnormal affect or behaviors.  No recent labs or imaging.  Assessment: 1.  Diarrhea: recent testing positive for pancreatic insufficiency, no history of pancreatitis or diabetes, uncertain cause, patient is somewhat  improved with Creon supplementation 2.  Screening for colorectal cancer: Patient's last colonoscopy in 2013 with recommendations for repeat in 10 years, she would like to have 1 more colonoscopy  Plan: 1.  Given recent change to diarrhea and patient's want for 1 last colonoscopy we will go ahead and schedule her with Dr. Tarri Glenn in the [facility].  This will be a screening colonoscopy as her diarrhea is improved with Creon and likely related to pancreatic insufficiency.  Patient received a detailed list of risks for the procedure and she agrees to proceed. Patient is appropriate for endoscopic procedure(s) in the ambulatory ([place]) setting.  2.  Continue Creon 36,000 lipase units, 2 with a meal and 1 with snack, could consider increasing this in the future if she has not seen the results she would like 3.  Ordered a CT of the abdomen with contrast for further evaluation given that she has been diagnosed with pancreatic insufficiency with unknown cause 4.  Patient to follow in clinic per recommendations from Dr. Tarri Glenn after time of colonoscopy/imaging above.  Ellouise Newer, PA-C [facility] 01/30/2022, 1:26 PM  Cc: Asencion Noble, MD

## 2022-01-30 NOTE — Patient Instructions (Addendum)
Continue Creon.  Your provider has requested that you go to the basement level for lab work before leaving today. Press "B" on the elevator. The lab is located at the first door on the left as you exit the elevator.  You have been scheduled for a CT scan of the abdomen and pelvis at [facility], 1st floor Radiology. You are scheduled on Tuesday 02/07/22 at 3 pm. You should arrive 15 minutes prior to your appointment time for registration.  We are giving you 1 bottles of contrast today that you will need to drink before arriving for the exam. The solution may taste better if refrigerated so put them in the refrigerator when you get home, but do NOT add ice or any other liquid to this solution as that would dilute it. Shake well before drinking.   Please follow the written instructions below on the day of your exam:   1) Do not eat anything after 11 am (4 hours prior to your test)   2) Drink 1 bottle of contrast @ 2 pm (1 hour prior to your exam)   You may take any medications as prescribed with a small amount of water, if necessary. If you take any of the following medications: METFORMIN, GLUCOPHAGE, GLUCOVANCE, AVANDAMET, RIOMET, FORTAMET, [place] MET, JANUMET, GLUMETZA or METAGLIP, you MAY be asked to HOLD this medication 48 hours AFTER the exam.   The purpose of you drinking the oral contrast is to aid in the visualization of your intestinal tract. The contrast solution may cause some diarrhea. Depending on your individual set of symptoms, you may also receive an intravenous injection of x-ray contrast/dye. Plan on being at [facility] for 45 minutes or longer, depending on the type of exam you are having performed.   If you have any questions regarding your exam or if you need to reschedule, you may call [facility] at [phone] between the hours of 8:00 am and 5:00 pm, Monday-Friday.

## 2022-01-31 NOTE — Progress Notes (Signed)
Reviewed.  Brisa Auth L. Whitaker Holderman, MD, MPH  

## 2022-02-01 ENCOUNTER — Ambulatory Visit: Payer: Medicare Other | Admitting: Physician Assistant

## 2022-02-07 ENCOUNTER — Ambulatory Visit (HOSPITAL_COMMUNITY)
Admission: RE | Admit: 2022-02-07 | Discharge: 2022-02-07 | Disposition: A | Discharge status: Home / Self Care | Payer: Medicare Other | Source: Ambulatory Visit | Attending: Physician Assistant | Admitting: Physician Assistant

## 2022-02-07 DIAGNOSIS — R188 Other ascites: Secondary | ICD-10-CM | POA: Diagnosis not present

## 2022-02-07 DIAGNOSIS — K8689 Other specified diseases of pancreas: Secondary | ICD-10-CM | POA: Insufficient documentation

## 2022-02-07 DIAGNOSIS — N281 Cyst of kidney, acquired: Secondary | ICD-10-CM | POA: Diagnosis not present

## 2022-02-07 DIAGNOSIS — K769 Liver disease, unspecified: Secondary | ICD-10-CM | POA: Diagnosis not present

## 2022-02-07 DIAGNOSIS — K661 Hemoperitoneum: Secondary | ICD-10-CM | POA: Diagnosis not present

## 2022-02-07 MED ORDER — IOHEXOL 300 MG/ML  SOLN
100.0000 mL | Freq: Once | INTRAMUSCULAR | Status: AC | PRN
  Administered 2022-02-07: 100 mL via INTRAVENOUS

## 2022-02-07 MED ORDER — SODIUM CHLORIDE (PF) 0.9 % IJ SOLN
INTRAMUSCULAR | Status: AC
  Filled 2022-02-07: qty 50

## 2022-02-10 ENCOUNTER — Encounter: Payer: Self-pay | Admitting: Gastroenterology

## 2022-02-10 ENCOUNTER — Telehealth: Payer: Self-pay

## 2022-02-10 ENCOUNTER — Ambulatory Visit (AMBULATORY_SURGERY_CENTER): Payer: Medicare Other | Admitting: Gastroenterology

## 2022-02-10 VITALS — BP 152/60 | HR 53 | Temp 98.0°F | Resp 11 | Ht 66.0 in | Wt 149.0 lb

## 2022-02-10 DIAGNOSIS — R194 Change in bowel habit: Secondary | ICD-10-CM

## 2022-02-10 DIAGNOSIS — K52832 Lymphocytic colitis: Secondary | ICD-10-CM | POA: Diagnosis not present

## 2022-02-10 DIAGNOSIS — K52839 Microscopic colitis, unspecified: Secondary | ICD-10-CM

## 2022-02-10 MED ORDER — SODIUM CHLORIDE 0.9 % IV SOLN: 500.0000 mL | Freq: Once | INTRAVENOUS | Status: DC

## 2022-02-10 NOTE — Telephone Encounter (Signed)
-----   Message from Thornton Park, MD sent at 02/10/2022 10:27 AM EDT ----- Please arrange follow-up with [facility] in 2-3 weeks. Thanks.  KLB

## 2022-02-10 NOTE — Progress Notes (Signed)
Pt's states no medical or surgical changes since previsit or office visit. 

## 2022-02-10 NOTE — Telephone Encounter (Signed)
Spoke with [facility] & they stated patient's CT has been moved to the top of the list and results will be posted today.

## 2022-02-10 NOTE — Telephone Encounter (Signed)
Pt was returning your call, let her know of her appointment. Says she will be here

## 2022-02-10 NOTE — Telephone Encounter (Signed)
Left message for patient to call back  

## 2022-02-10 NOTE — Progress Notes (Signed)
PT taken to PACU. Monitors in place. VSS. Report given to RN.   Near the end of the procedure, she vomited 236m of bile colored fluid. Prompt oral suctioning performed ;head of bead lowered to prevent aspiration. Discussed the risk of aspiration with the patient and her caregiver. She has been instructed to seek medical attention if any issues arise.

## 2022-02-10 NOTE — Progress Notes (Signed)
Indication for colonoscopy: Colon cancer screening  Please see the 01/30/2022 office note for complete details.  There is been no significant change in history or physical exam since that time.  She remains an appropriate candidate for monitored anesthesia care in the endoscopy center.1

## 2022-02-10 NOTE — Telephone Encounter (Signed)
-----   Message from Thornton Park, MD sent at 02/10/2022 10:09 AM EDT ----- Regarding: CT results Patient had CT 02/07/22. It has not yet resulted. Would you please ask the radiologist for the results? Patient is here today for colonoscopy asking about them. Thanks.   KLB

## 2022-02-10 NOTE — Op Note (Addendum)
[facility] Patient Name: Ruth Murray Procedure Date: 02/10/2022 10:10 AM MRN: [id number] Endoscopist: Thornton Park MD, MD Age: 81 Referring MD:  Date of Birth: July 02, 1940 Gender: Female Account #: [id number] Procedure:                Colonoscopy Indications:              Screening for colorectal malignant neoplasm,                            Incidental diarrhea noted                           Last colonoscopy 2013 Medicines:                Monitored Anesthesia Care Procedure:                Pre-Anesthesia Assessment:                           - Prior to the procedure, a History and Physical                            was performed, and patient medications and                            allergies were reviewed. The patient's tolerance of                            previous anesthesia was also reviewed. The risks                            and benefits of the procedure and the sedation                            options and risks were discussed with the patient.                            All questions were answered, and informed consent                            was obtained. Prior Anticoagulants: The patient has                            taken no previous anticoagulant or antiplatelet                            agents. ASA Grade Assessment: II - A patient with                            mild systemic disease. After reviewing the risks                            and benefits, the patient was deemed in  satisfactory condition to undergo the procedure.                           After obtaining informed consent, the colonoscope                            was passed under direct vision. Throughout the                            procedure, the patient's blood pressure, pulse, and                            oxygen saturations were monitored continuously. The                            CF [id number] #[id number] was introduced through the anus                             and advanced to the 3 cm into the ileum. A second                            forward view of the right colon was performed. The                            colonoscopy was performed without difficulty. The                            patient tolerated the procedure well. The quality                            of the bowel preparation was good. Scope In: 10:14:56 AM Scope Out: 10:26:49 AM Scope Withdrawal Time: 0 hours 8 minutes 53 seconds  Total Procedure Duration: 0 hours 11 minutes 53 seconds  Findings:                 The perianal and digital rectal examinations were                            normal.                           The entire examined colonic mucosa appeared normal.                            Biopsies for histology were taken with a cold                            forceps from the right colon and left colon for                            evaluation of microscopic colitis. Estimated blood                            loss was minimal.  The entire examine was otherwise colon appeared                            normal on direct and retroflexion views. Complications:            Vomiting during the procedure. No witnessed                            aspiration. No hypoxia occurred. Estimated Blood Loss:     Estimated blood loss: none. Impression:               - The entire examined colon is normal. Biopsied.                           - The entire examined colon is normal on direct and                            retroflexion views. Recommendation:           - Patient has a contact number available for                            emergencies. The signs and symptoms of potential                            delayed complications were discussed with the                            patient. Return to normal activities tomorrow.                            Written discharge instructions were provided to the                            patient.                            - Resume previous diet.                           - Continue present medications.                           - Await pathology results.                           - Repeat colonoscopy is not recommended for                            surveillance due to current age.                           - Emerging evidence supports eating a diet of                            fruits, vegetables, grains, calcium, and yogurt  while reducing red meat and alcohol may reduce the                            risk of colon cancer.                           - Thank you for allowing me to be involved in your                            colon cancer prevention. Thornton Park MD, MD 02/10/2022 10:34:33 AM This report has been signed electronically.

## 2022-02-10 NOTE — Patient Instructions (Signed)
The biopsies taken today have been sent for pathology.  The results can take 1-3 weeks to receive.  When your next colonoscopy should occur will be based on the pathology results.    You may resume your previous diet and medication schedule.  Thank you for allowing [place] to care for you today!!!   YOU HAD AN ENDOSCOPIC PROCEDURE TODAY AT [place] [facility]:   Refer to the procedure report that was given to you for any specific questions about what was found during the examination.  If the procedure report does not answer your questions, please call your gastroenterologist to clarify.  If you requested that your care partner not be given the details of your procedure findings, then the procedure report has been included in a sealed envelope for you to review at your convenience later.  YOU SHOULD EXPECT: Some feelings of bloating in the abdomen. Passage of more gas than usual.  Walking can help get rid of the air that was put into your GI tract during the procedure and reduce the bloating. If you had a lower endoscopy (such as a colonoscopy or flexible sigmoidoscopy) you may notice spotting of blood in your stool or on the toilet paper. If you underwent a bowel prep for your procedure, you may not have a normal bowel movement for a few days.  Please Note:  You might notice some irritation and congestion in your nose or some drainage.  This is from the oxygen used during your procedure.  There is no need for concern and it should clear up in a day or so.  SYMPTOMS TO REPORT IMMEDIATELY:  Following lower endoscopy (colonoscopy or flexible sigmoidoscopy):  Excessive amounts of blood in the stool  Significant tenderness or worsening of abdominal pains  Swelling of the abdomen that is new, acute  Fever of 100F or higher  For urgent or emergent issues, a gastroenterologist can be reached at any hour by calling [phone]. Do not use MyChart messaging for urgent concerns.    DIET:  We do  recommend a small meal at first, but then you may proceed to your regular diet.  Drink plenty of fluids but you should avoid alcoholic beverages for 24 hours.  ACTIVITY:  You should plan to take it easy for the rest of today and you should NOT DRIVE or use heavy machinery until tomorrow (because of the sedation medicines used during the test).    FOLLOW UP: Our staff will call the number listed on your records the next business day following your procedure.  We will call around 7:15- 8:00 am to check on you and address any questions or concerns that you may have regarding the information given to you following your procedure. If we do not reach you, we will leave a message.  If you develop any symptoms (ie: fever, flu-like symptoms, shortness of breath, cough etc.) before then, please call [phone].  If you test positive for Covid 19 in the 2 weeks post procedure, please call and report this information to [place].    If any biopsies were taken you will be contacted by phone or by letter within the next 1-3 weeks.  Please call us at [phone] if you have not heard about the biopsies in 3 weeks.    SIGNATURES/CONFIDENTIALITY: You and/or your care partner have signed paperwork which will be entered into your electronic medical record.  These signatures attest to the fact that that the information above on your After Visit  Summary has been reviewed and is understood.  Full responsibility of the confidentiality of this discharge information lies with you and/or your care-partner.

## 2022-02-10 NOTE — Telephone Encounter (Signed)
Patient has been scheduled for follow up on 03/14/22 at 10:00 am with [place], [place]. Will notify patient once she is discharged home.

## 2022-02-10 NOTE — Progress Notes (Signed)
Called to room to assist during endoscopic procedure.  Patient ID and intended procedure confirmed with present staff. Received instructions for my participation in the procedure from the performing physician.  

## 2022-02-13 ENCOUNTER — Other Ambulatory Visit: Payer: Self-pay | Admitting: Internal Medicine

## 2022-02-13 ENCOUNTER — Telehealth: Payer: Self-pay

## 2022-02-13 DIAGNOSIS — N289 Disorder of kidney and ureter, unspecified: Secondary | ICD-10-CM

## 2022-02-13 NOTE — Telephone Encounter (Signed)
Attempted f/u call. No answer, left VM. 

## 2022-02-20 ENCOUNTER — Other Ambulatory Visit: Payer: Self-pay

## 2022-02-21 ENCOUNTER — Other Ambulatory Visit: Payer: Self-pay

## 2022-02-21 MED ORDER — BUDESONIDE 3 MG PO CPEP: 9.0000 mg | ORAL_CAPSULE | Freq: Every day | ORAL | 0 refills | Status: AC

## 2022-03-09 ENCOUNTER — Ambulatory Visit
Admission: RE | Admit: 2022-03-09 | Discharge: 2022-03-09 | Disposition: A | Discharge status: Home / Self Care | Payer: Medicare Other | Source: Ambulatory Visit | Attending: Internal Medicine | Admitting: Internal Medicine

## 2022-03-09 DIAGNOSIS — N281 Cyst of kidney, acquired: Secondary | ICD-10-CM | POA: Diagnosis not present

## 2022-03-09 DIAGNOSIS — K7689 Other specified diseases of liver: Secondary | ICD-10-CM | POA: Diagnosis not present

## 2022-03-09 DIAGNOSIS — N289 Disorder of kidney and ureter, unspecified: Secondary | ICD-10-CM

## 2022-03-09 MED ORDER — GADOBENATE DIMEGLUMINE 529 MG/ML IV SOLN
12.0000 mL | Freq: Once | INTRAVENOUS | Status: AC | PRN
  Administered 2022-03-09: 12 mL via INTRAVENOUS

## 2022-03-14 ENCOUNTER — Encounter: Payer: Self-pay | Admitting: Physician Assistant

## 2022-03-14 ENCOUNTER — Ambulatory Visit (INDEPENDENT_AMBULATORY_CARE_PROVIDER_SITE_OTHER): Payer: Medicare Other | Admitting: Physician Assistant

## 2022-03-14 VITALS — BP 148/72 | HR 52 | Ht 66.0 in | Wt 148.0 lb

## 2022-03-14 DIAGNOSIS — K8689 Other specified diseases of pancreas: Secondary | ICD-10-CM

## 2022-03-14 DIAGNOSIS — K52832 Lymphocytic colitis: Secondary | ICD-10-CM

## 2022-03-14 NOTE — Progress Notes (Signed)
Chief Complaint: Follow-up change in bowel habits  HPI:    Ruth Murray is an 81 year old female with a past medical history as listed below, known to Dr. Tarri Glenn, who returns to clinic today for follow-up of her change in bowel habits. 02/09/2012 colonoscopy normal with mild minimal sigmoid diverticulosis.  Repeat recommended in 10 years.    11/30/2021 patient seen in clinic by me for diarrhea.  At that time patient described that she was having looser stools worse in the morning.  She had been on magnesium supplementation for 2 years and had run out for a week but no change in bowel movements.  Also tried stopping dairy with no change.  At that time recommend the patient hold her Magnesium.  Recommend she start a Fiber supplement and Align.  Explained that we have discussed a colonoscopy if she continues abnormal bowel habits.    12/29/2021 fecal pancreatic elastase returned abnormally low.  Patient was told to start on Creon.    01/30/2022 patient seen in clinic and at that time was taking Creon 2 with a meal and 1 with a snack and had more formed stools.  At that time patient was scheduled for her screening colonoscopy.  Continued on Creon 36,000 lipase units 2 with a meal and 1 with a snack.  Also ordered CT of the abdomen with contrast for further evaluation given that she had been diagnosed with pancreatic insufficiency with unknown cause.    02/10/2022 CT of the abdomen with contrast showed no findings to explain the patient's clinical history, also cyst in the liver, small lesions in the lower pole the left kidney, liver may be mildly steatotic, aortic atherosclerosis.    02/10/2022 colonoscopy was completely normal.  No repeat recommended due to age.  Biopsies positive for lymphocytic colitis.  Patient started on Budesonide.    03/09/2022 MRI of the abdomen with and without contrast ordered by PCP for follow-through of abnormal kidney, multiple simple fluid signal, benign renal cortical and parapelvic  cysts of the left kidney with specific attenuation to the lower left pole as queried by prior CT, no suspicious mass or contrast.    Today, the patient tells me that she had run out of Creon and was unable to get it from the pharmacy due to shortage, so her diarrhea had come back in full force prior to time of colonoscopy.  After colonoscopy she was told she had "colitis", and started on Budesonide 3 capsules a day.  She took this for 2 weeks but became very constipated so she decreased it to 2 capsules a day for the past 2 weeks and feels like she is still constipated sometimes not having a bowel movement for 1 or 2 days.  In between her stools are solid.  Denies abdominal pain.    Denies fever, chills, weight loss or blood in her stool.  Past Medical History:  Diagnosis Date   Arthritis    Atypical nevus 06/12/2012   mild Right nose   Basal cell carcinoma    [place]ark level II melanoma ([place]) 10/15/2018   right side of nose   Hyperlipidemia    Hypertension    Lentigo maligna ([place]) 07/26/1999   right front thigh--tx excision    Past Surgical History:  Procedure Laterality Date   DEBRIDEMENT TENNIS ELBOW  06/05/1980   right   GANGLION CYST EXCISION  06/05/2008   wrist   REPLACEMENT TOTAL HIP W/  RESURFACING IMPLANTS Right    REPLACEMENT TOTAL KNEE  TONSILLECTOMY  06/06/1963   TRIGGER FINGER RELEASE Right 03/15/2021   Procedure: RELEASE TRIGGER FINGER/A-1 PULLEY RIGHT MIDDLE FINGER AND RIGHT RING FINGER;  Surgeon: Daryll Brod, MD;  Location: [facility];  Service: Orthopedics;  Laterality: Right;    Current Outpatient Medications  Medication Sig Dispense Refill   allopurinol (ZYLOPRIM) 300 MG tablet allopurinol 300 mg tablet     amLODipine (NORVASC) 5 MG tablet amlodipine 5 mg tablet     atenolol (TENORMIN) 25 MG tablet atenolol 25 mg tablet     atorvastatin (LIPITOR) 10 MG tablet atorvastatin 10 mg tablet     budesonide (ENTOCORT EC) 3 MG 24 hr capsule Take 3 capsules  (9 mg total) by mouth daily. 90 capsule 0   calcium carbonate 1250 MG capsule Take by mouth.     Calcium Carbonate-Vitamin D (CALCIUM + D PO) Take by mouth daily.     lipase/protease/amylase (CREON) 36000 UNITS CPEP capsule Take 2 capsules (72,000 Units total) by mouth 3 (three) times daily with meals. May also take 1 capsule (36,000 Units total) as needed (with snacks). 240 capsule 11   Multiple Vitamin (MULTIVITAMIN) tablet Take 1 tablet by mouth daily.     pyridOXINE (VITAMIN B-6) 100 MG tablet Take 100 mg by mouth daily.     vitamin B-12 (CYANOCOBALAMIN) 100 MCG tablet Take 100 mcg by mouth daily.     vitamin E 1000 UNIT capsule Take 1,000 Units by mouth daily.     No current facility-administered medications for this visit.    Allergies as of 03/14/2022 - Review Complete 02/10/2022  Allergen Reaction Noted   Codeine Nausea Only and Nausea And Vomiting 08/05/2013   Sulfa antibiotics  09/05/2017   Tape Other (See Comments) 05/05/2014    Family History  Problem Relation Age of Onset   Heart disease Mother    Colon cancer Neg Hx    Stomach cancer Neg Hx    Esophageal cancer Neg Hx    Rectal cancer Neg Hx     Social History   Socioeconomic History   Marital status: Married    Spouse name: Not on file   Number of children: 3   Years of education: Not on file   Highest education level: Not on file  Occupational History   Occupation: retired  Tobacco Use   Smoking status: Former    Types: Cigarettes    Quit date: 01/26/1972    Years since quitting: 50.1   Smokeless tobacco: Never  Vaping Use   Vaping Use: Never used  Substance and Sexual Activity   Alcohol use: Yes    Alcohol/week: 3.0 standard drinks of alcohol    Types: 3 Standard drinks or equivalent per week    Comment: a glass of wine at night   Drug use: Never   Sexual activity: Not on file  Other Topics Concern   Not on file  Social History Narrative   Not on file   Social Determinants of Health    Financial Resource Strain: Not on file  Food Insecurity: Not on file  Transportation Needs: Not on file  Physical Activity: Not on file  Stress: Not on file  Social Connections: Not on file  Intimate Partner Violence: Not on file    Review of Systems:    Constitutional: No weight loss, fever or chills Cardiovascular: No chest pain Respiratory: No SOB  Gastrointestinal: See HPI and otherwise negative   Physical Exam:  Vital signs: BP (!) 148/72   Pulse Marland Kitchen)  52   Ht '5\' 6"'$  (1.676 m)   Wt 148 lb (67.1 kg)   SpO2 95%   BMI 23.89 kg/m    Constitutional:   Pleasant Elderly Caucasian female appears to be in NAD, Well developed, Well nourished, alert and cooperative Respiratory: Respirations even and unlabored. Lungs clear to auscultation bilaterally.   No wheezes, crackles, or rhonchi.  Cardiovascular: Normal S1, S2. No MRG. Regular rate and rhythm. No peripheral edema, cyanosis or pallor.  Gastrointestinal:  Soft, nondistended, nontender. No rebound or guarding. Normal bowel sounds. No appreciable masses or hepatomegaly. Rectal:  Not performed.  Psychiatric: Oriented to person, place and time. Demonstrates good judgement and reason without abnormal affect or behaviors.  No recent labs or imaging.  Assessment: 1.  Lymphocytic colitis: Found at time of colonoscopy responded well to Budesonide 2.  Pancreatic insufficiency: Previously diagnosed on fecal testing, did have improvement with Creon, normal CT abdomen of the pancreas, patient then had colonoscopy with lymphocytic colitis as above and responded even better to Budesonide  Plan: 1.  Budesonide '6mg'$  qd is causing constipation, we will go ahead and taper down to 3 mg daily for the next 2 weeks and then patient can discontinue. 2.  Discussed with patient that if she has return of symptoms after coming off the medicine she may need to stay on it for a longer period of time, if she has a flare in the future she can call and let us  know, if she does would start her on just 6 mg daily for a couple of weeks with plans to quickly taper. 3.  Patient will call and let us know how she is doing and follow-up with [place] as needed in the future.  Ellouise Newer, PA-C [facility] 03/14/2022, 10:06 AM  Cc: Asencion Noble, MD

## 2022-03-14 NOTE — Patient Instructions (Addendum)
Take Budesonide 1 capsule daily for 2 weeks, then stop.  Follow up as needed.  If you are age 80 or older, your body mass index should be between 23-30. Your Body mass index is 23.89 kg/m. If this is out of the aforementioned range listed, please consider follow up with your Primary Care Provider.  __________________________________________________________  The [facility] providers would like to encourage you to use [facility] to communicate with providers for non-urgent requests or questions.  Due to long hold times on the telephone, sending your provider a message by [facility] may be a faster and more efficient way to get a response.  Please allow 48 business hours for a response.  Please remember that this is for non-urgent requests.    Due to recent changes in healthcare laws, you may see the results of your imaging and laboratory studies on MyChart before your provider has had a chance to review them.  We understand that in some cases there may be results that are confusing or concerning to you. Not all laboratory results come back in the same time frame and the provider may be waiting for multiple results in order to interpret others.  Please give [place] 48 hours in order for your provider to thoroughly review all the results before contacting the office for clarification of your results.    Thank you for choosing me and [facility].  Ellouise Newer, PA-C

## 2022-03-22 DIAGNOSIS — H30043 Focal chorioretinal inflammation, macular or paramacular, bilateral: Secondary | ICD-10-CM | POA: Diagnosis not present

## 2022-03-22 DIAGNOSIS — H353131 Nonexudative age-related macular degeneration, bilateral, early dry stage: Secondary | ICD-10-CM | POA: Diagnosis not present

## 2022-03-22 DIAGNOSIS — H35353 Cystoid macular degeneration, bilateral: Secondary | ICD-10-CM | POA: Diagnosis not present

## 2022-03-22 DIAGNOSIS — H31093 Other chorioretinal scars, bilateral: Secondary | ICD-10-CM | POA: Diagnosis not present

## 2022-03-27 NOTE — Progress Notes (Signed)
Reviewed and agree with management plans. ? ?Mayerli Kirst L. Alishah Schulte, MD, MPH  ?

## 2022-04-03 ENCOUNTER — Ambulatory Visit: Payer: Medicare Other | Admitting: Dermatology

## 2022-05-02 DIAGNOSIS — D1801 Hemangioma of skin and subcutaneous tissue: Secondary | ICD-10-CM | POA: Diagnosis not present

## 2022-05-02 DIAGNOSIS — L821 Other seborrheic keratosis: Secondary | ICD-10-CM | POA: Diagnosis not present

## 2022-05-02 DIAGNOSIS — L578 Other skin changes due to chronic exposure to nonionizing radiation: Secondary | ICD-10-CM | POA: Diagnosis not present

## 2022-05-02 DIAGNOSIS — D229 Melanocytic nevi, unspecified: Secondary | ICD-10-CM | POA: Diagnosis not present

## 2022-05-02 DIAGNOSIS — Z85820 Personal history of malignant melanoma of skin: Secondary | ICD-10-CM | POA: Diagnosis not present

## 2022-05-02 DIAGNOSIS — L814 Other melanin hyperpigmentation: Secondary | ICD-10-CM | POA: Diagnosis not present

## 2022-08-02 DIAGNOSIS — H35353 Cystoid macular degeneration, bilateral: Secondary | ICD-10-CM | POA: Diagnosis not present

## 2022-08-02 DIAGNOSIS — H353131 Nonexudative age-related macular degeneration, bilateral, early dry stage: Secondary | ICD-10-CM | POA: Diagnosis not present

## 2022-08-02 DIAGNOSIS — H59813 Chorioretinal scars after surgery for detachment, bilateral: Secondary | ICD-10-CM | POA: Diagnosis not present

## 2022-08-02 DIAGNOSIS — H30043 Focal chorioretinal inflammation, macular or paramacular, bilateral: Secondary | ICD-10-CM | POA: Diagnosis not present

## 2022-11-28 ENCOUNTER — Other Ambulatory Visit: Payer: Self-pay | Admitting: Internal Medicine

## 2022-11-28 DIAGNOSIS — G2581 Restless legs syndrome: Secondary | ICD-10-CM | POA: Diagnosis not present

## 2022-11-28 DIAGNOSIS — I10 Essential (primary) hypertension: Secondary | ICD-10-CM | POA: Diagnosis not present

## 2022-11-28 DIAGNOSIS — Z79899 Other long term (current) drug therapy: Secondary | ICD-10-CM | POA: Diagnosis not present

## 2022-11-28 DIAGNOSIS — M1000 Idiopathic gout, unspecified site: Secondary | ICD-10-CM | POA: Diagnosis not present

## 2022-11-28 DIAGNOSIS — Z1231 Encounter for screening mammogram for malignant neoplasm of breast: Secondary | ICD-10-CM

## 2022-12-04 DIAGNOSIS — G7000 Myasthenia gravis without (acute) exacerbation: Secondary | ICD-10-CM | POA: Diagnosis not present

## 2022-12-04 DIAGNOSIS — M109 Gout, unspecified: Secondary | ICD-10-CM | POA: Diagnosis not present

## 2022-12-04 DIAGNOSIS — I10 Essential (primary) hypertension: Secondary | ICD-10-CM | POA: Diagnosis not present

## 2023-01-02 ENCOUNTER — Ambulatory Visit
Admission: RE | Admit: 2023-01-02 | Discharge: 2023-01-02 | Disposition: A | Discharge status: Home / Self Care | Payer: Medicare Other | Source: Ambulatory Visit | Attending: Internal Medicine | Admitting: Internal Medicine

## 2023-01-02 DIAGNOSIS — Z1231 Encounter for screening mammogram for malignant neoplasm of breast: Secondary | ICD-10-CM

## 2023-01-05 DIAGNOSIS — M7918 Myalgia, other site: Secondary | ICD-10-CM | POA: Diagnosis not present

## 2023-01-05 DIAGNOSIS — M545 Low back pain, unspecified: Secondary | ICD-10-CM | POA: Diagnosis not present

## 2023-01-31 DIAGNOSIS — H30043 Focal chorioretinal inflammation, macular or paramacular, bilateral: Secondary | ICD-10-CM | POA: Diagnosis not present

## 2023-01-31 DIAGNOSIS — H353131 Nonexudative age-related macular degeneration, bilateral, early dry stage: Secondary | ICD-10-CM | POA: Diagnosis not present

## 2023-01-31 DIAGNOSIS — H59813 Chorioretinal scars after surgery for detachment, bilateral: Secondary | ICD-10-CM | POA: Diagnosis not present

## 2023-01-31 DIAGNOSIS — H35353 Cystoid macular degeneration, bilateral: Secondary | ICD-10-CM | POA: Diagnosis not present

## 2023-02-16 DIAGNOSIS — H30042 Focal chorioretinal inflammation, macular or paramacular, left eye: Secondary | ICD-10-CM | POA: Diagnosis not present

## 2023-03-09 DIAGNOSIS — H59813 Chorioretinal scars after surgery for detachment, bilateral: Secondary | ICD-10-CM | POA: Diagnosis not present

## 2023-03-09 DIAGNOSIS — H30043 Focal chorioretinal inflammation, macular or paramacular, bilateral: Secondary | ICD-10-CM | POA: Diagnosis not present

## 2023-03-09 DIAGNOSIS — H35353 Cystoid macular degeneration, bilateral: Secondary | ICD-10-CM | POA: Diagnosis not present

## 2023-03-09 DIAGNOSIS — H353131 Nonexudative age-related macular degeneration, bilateral, early dry stage: Secondary | ICD-10-CM | POA: Diagnosis not present

## 2023-04-09 DIAGNOSIS — L821 Other seborrheic keratosis: Secondary | ICD-10-CM | POA: Diagnosis not present

## 2023-04-09 DIAGNOSIS — L570 Actinic keratosis: Secondary | ICD-10-CM | POA: Diagnosis not present

## 2023-04-25 DIAGNOSIS — H59813 Chorioretinal scars after surgery for detachment, bilateral: Secondary | ICD-10-CM | POA: Diagnosis not present

## 2023-04-25 DIAGNOSIS — H353131 Nonexudative age-related macular degeneration, bilateral, early dry stage: Secondary | ICD-10-CM | POA: Diagnosis not present

## 2023-04-25 DIAGNOSIS — H30043 Focal chorioretinal inflammation, macular or paramacular, bilateral: Secondary | ICD-10-CM | POA: Diagnosis not present

## 2023-04-25 DIAGNOSIS — H35353 Cystoid macular degeneration, bilateral: Secondary | ICD-10-CM | POA: Diagnosis not present

## 2023-06-08 DIAGNOSIS — I10 Essential (primary) hypertension: Secondary | ICD-10-CM | POA: Diagnosis not present

## 2023-06-08 DIAGNOSIS — R7309 Other abnormal glucose: Secondary | ICD-10-CM | POA: Diagnosis not present

## 2023-06-08 DIAGNOSIS — G2581 Restless legs syndrome: Secondary | ICD-10-CM | POA: Diagnosis not present

## 2023-06-28 ENCOUNTER — Encounter (INDEPENDENT_AMBULATORY_CARE_PROVIDER_SITE_OTHER): Payer: Self-pay

## 2023-06-28 ENCOUNTER — Ambulatory Visit (INDEPENDENT_AMBULATORY_CARE_PROVIDER_SITE_OTHER): Payer: Medicare Other | Admitting: Otolaryngology

## 2023-06-28 ENCOUNTER — Ambulatory Visit (INDEPENDENT_AMBULATORY_CARE_PROVIDER_SITE_OTHER): Payer: Self-pay | Admitting: Audiology

## 2023-06-28 VITALS — HR 62 | Ht 66.0 in | Wt 150.0 lb

## 2023-06-28 DIAGNOSIS — H6123 Impacted cerumen, bilateral: Secondary | ICD-10-CM

## 2023-06-28 DIAGNOSIS — H9190 Unspecified hearing loss, unspecified ear: Secondary | ICD-10-CM

## 2023-06-28 IMAGING — MR MR SHOULDER*R* W/O CM
5 series · 36 of 40 positions shown · non-contrast
Comparison: None.

CLINICAL DATA: Bilateral shoulder pain and limited range of motion
for 6 months. No known injury.

EXAM:
MRI OF THE RIGHT SHOULDER WITHOUT CONTRAST
TECHNIQUE: Multiplanar, multisequence MR imaging of the shoulder was performed.
No intravenous contrast was administered.

[Series 3: T2 fat-sat · axial · 4.0mm · 0.55mm/px · z∈[-17,+91]mm · 8 of 25 slices shown (1 of 3)]
[im 1/25]
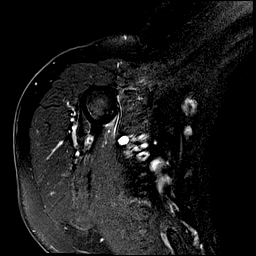
[im 4/25]
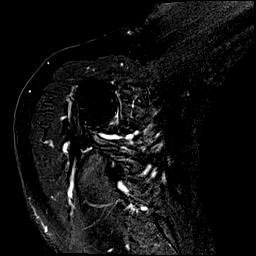
[im 7/25]
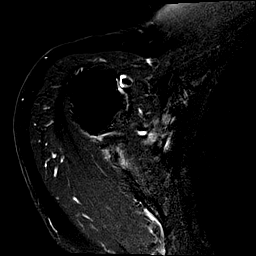
[im 11/25]
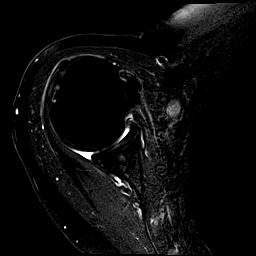
[im 14/25]
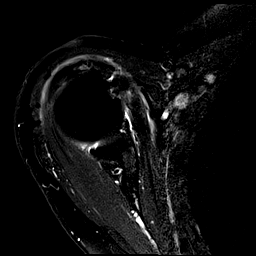
[im 18/25]
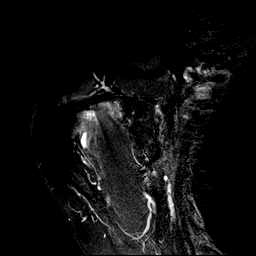
[im 21/25]
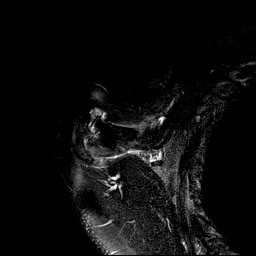
[im 25/25]
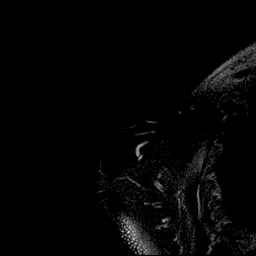

[Series 6: T2 fat-sat · oblique · 4.0mm · 0.59mm/px · 9 of 25 slices shown (2 of 3)]
[im 1/25]
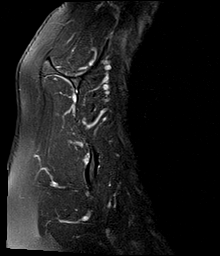
[im 4/25]
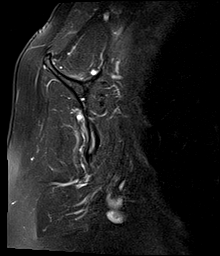
[im 7/25]
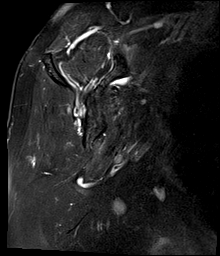
[im 10/25]
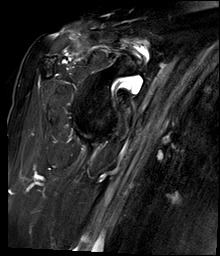
[im 13/25]
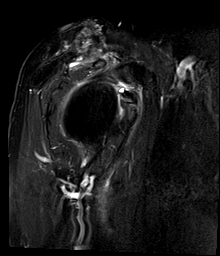
[im 16/25]
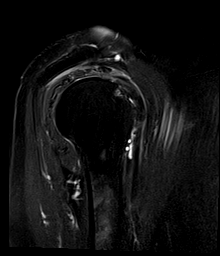
[im 19/25]
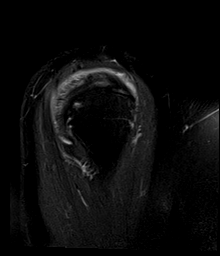
[im 22/25]
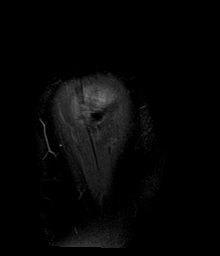
[im 25/25]
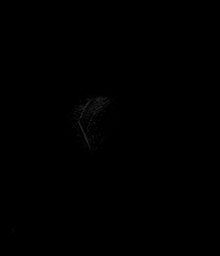

[Series 7: T1 · oblique · 4.0mm · 0.29mm/px · 5 of 25 slices shown]
[im 1/25]
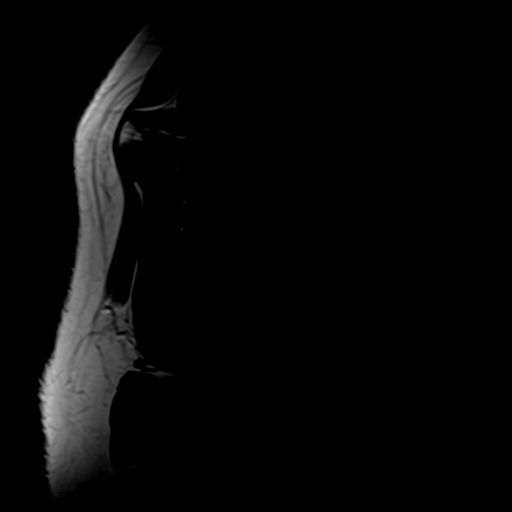
[im 4/25]
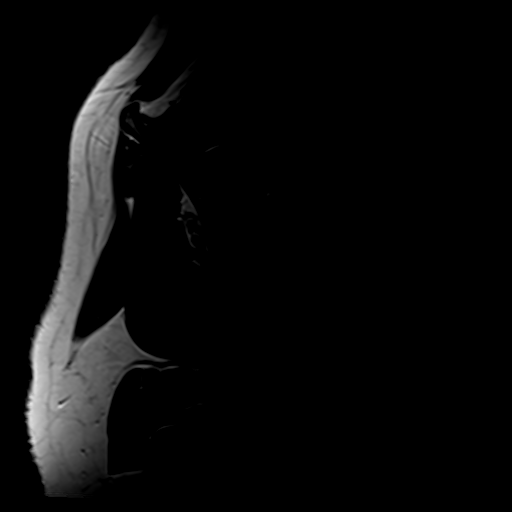
[im 7/25]
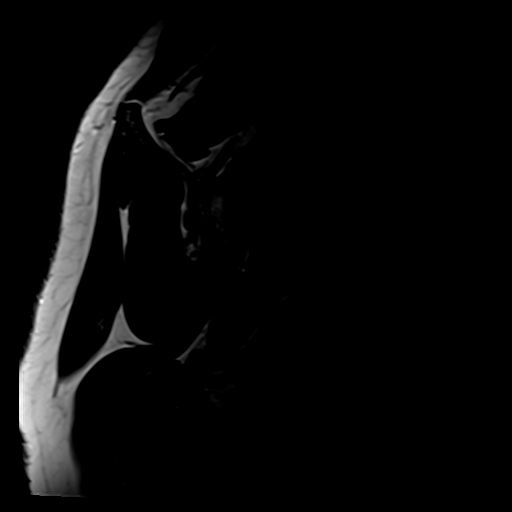
[im 10/25]
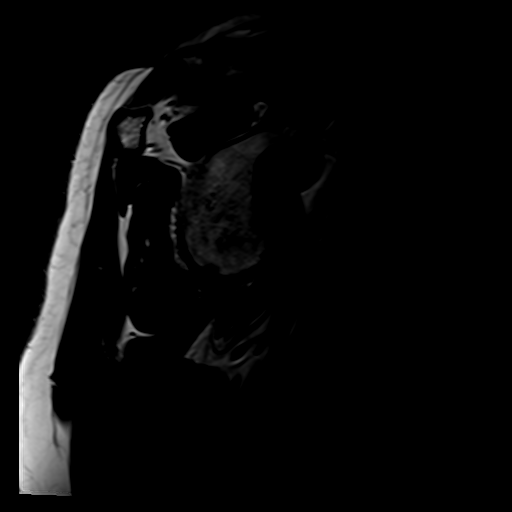
[im 16/25]
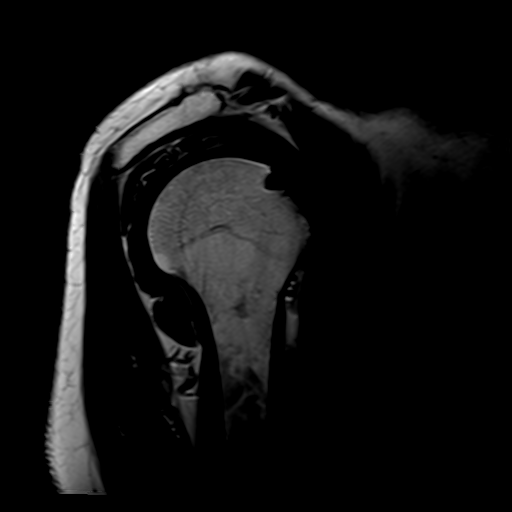

[Series 8: PD · oblique · 4.0mm · 0.29mm/px · 7 of 21 slices shown]
[im 1/21]
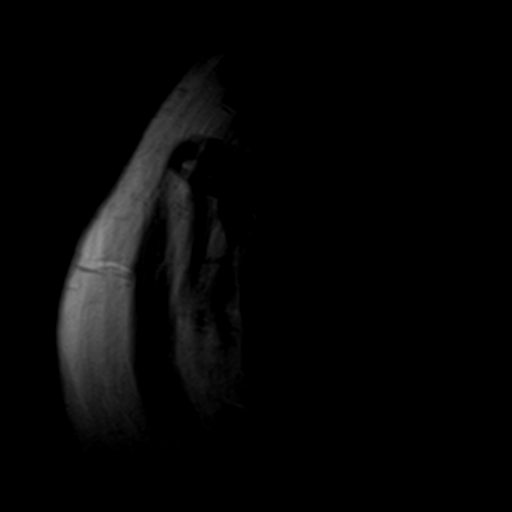
[im 4/21]
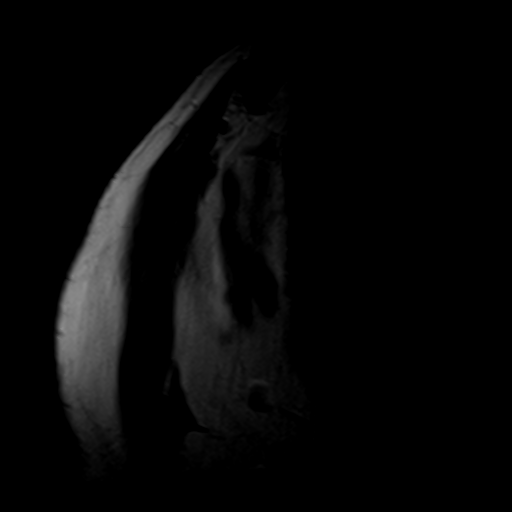
[im 7/21]
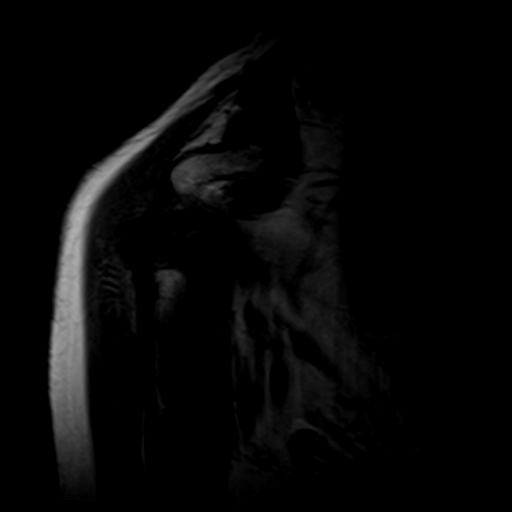
[im 11/21]
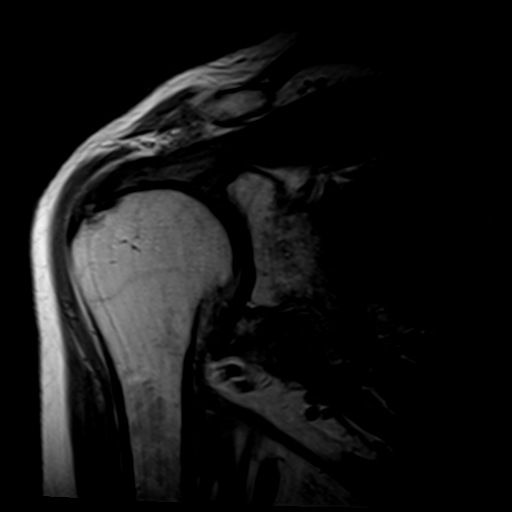
[im 14/21]
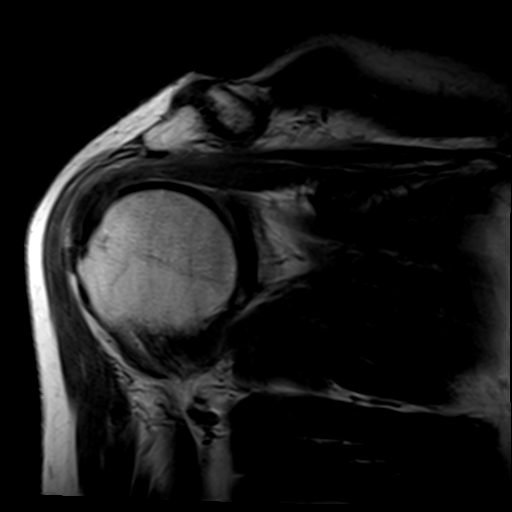
[im 17/21]
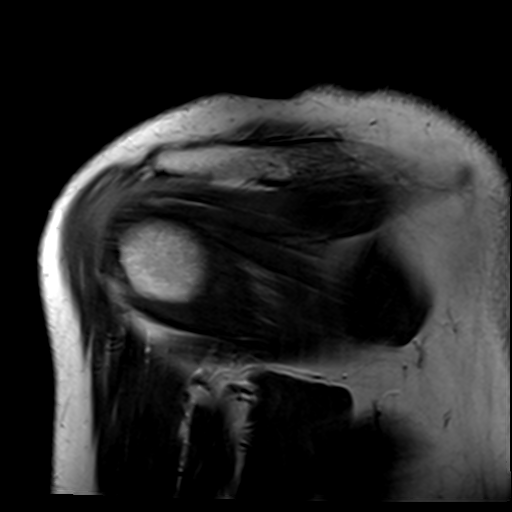
[im 21/21]
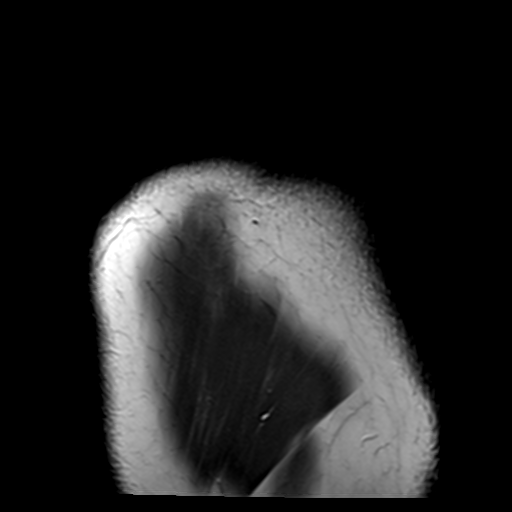

[Series 9: T2 fat-sat · oblique · 4.0mm · 0.59mm/px · 7 of 21 slices shown (3 of 3)]
[im 1/21]
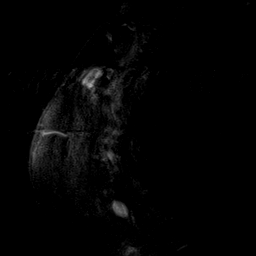
[im 4/21]
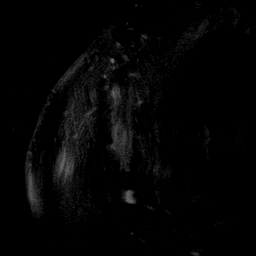
[im 7/21]
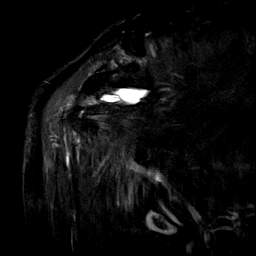
[im 11/21]
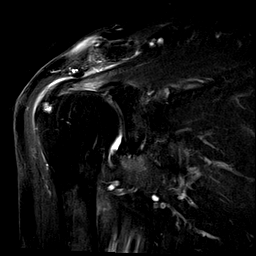
[im 14/21]
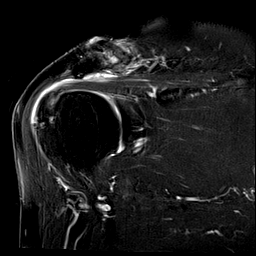
[im 17/21]
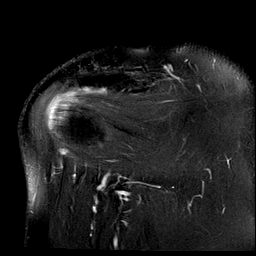
[im 21/21]
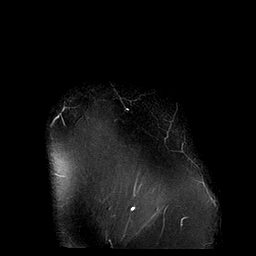

[36 of 40 positions shown; findings below may reference images not displayed]

FINDINGS: Rotator cuff: Intact. There is thickening and heterogeneously
increased T2 signal in the rotator cuff tendons.

Muscles:  Normal.  No atrophy or focal lesion.

Biceps long head: Intact. Mild appearing tendinopathy of the
intra-articular segment noted.

Acromioclavicular Joint: Moderate to moderately severe
osteoarthritis. Type 2 acromion. A small to moderate volume of fluid
is seen in the subacromial/subdeltoid bursa.

Glenohumeral Joint: Minimal degenerative change is present.

Labrum:  Intact.

Bones:  No fracture or focal lesion.

Other: None.
IMPRESSION: Moderate appearing rotator cuff tendinopathy without tear. Mild
appearing tendinopathy of the intra-articular long head of biceps
also noted.

Moderate to moderately severe acromioclavicular osteoarthritis.

Subacromial/subdeltoid fluid consistent with bursitis.

## 2023-06-28 NOTE — Progress Notes (Signed)
Patient ID: Ruth Murray, female   DOB: [birth year], 83 y.o.   MRN: [id number]  Procedure: Bilateral cerumen disimpaction.    Indication: Cerumen impaction, resulting in ear discomfort and conductive hearing loss.    Description: The patient is placed supine on the exam table.  Under the operating microscope, the right ear canal is examined and is noted to be completely impacted with cerumen.  The cerumen is carefully removed with a combination of suction catheters, cerumen curette, and alligator forceps.  After the cerumen removal, the ear canal and tympanic membrane are noted to be normal.  No middle ear effusion is noted.  The same procedure is then repeated on the left side without exception. The patient tolerated the procedure well.  Follow-up care:  The patient is instructed not to use Q-tips to clean the ear canals.  The patient will follow up on a P.R.N. basis.

## 2023-06-28 NOTE — Progress Notes (Signed)
  [address] [place], [place] [address] [phone]  Hearing Aid Check     Ruth Murray comes for a work in appointment for a hearing aid clean while she is at the clinic for a scheduled appointment to see Dr. Suszanne Conners.   Accompanied [id number]   Right Left  Hearing aid manufacturer Resound mini Omnia 9 Resound mini Omnia 9  Hearing aid style Receiver in the canal Receiver in the canal  Hearing aid Research scientist (physical sciences)    Dome/ custom earpiece Small power dome Small power dome  Retention wire no no  Warranty expiration date 08-27-2024 08-27-2024  Loss and Damage    Additional accessories Expiration date TV Streamer warranty: 08-08-2022 Charger: warranty:08-27-2024  Initial fitting date 08-10-2021 08-10-2021  Device was fit at: [facility]     Chief complaint: Patient reports would like to have her hearing aids cleaned.  Actions taken: Inspection of the device and listening check showed that that they had minimal wax around the receiver. Domes, filters, and microphones were cleaned or changed.   Services fee: $0 was paid at checkout.    Recommend: Return for a hearing aid check, as needed. Return for a hearing evaluation and to see an ENT, if concerns with hearing changes arise.    Ruth Murray MARIE LEROUX-MARTINEZ, AUD

## 2023-06-29 DIAGNOSIS — M25561 Pain in right knee: Secondary | ICD-10-CM | POA: Diagnosis not present

## 2023-06-29 DIAGNOSIS — M1711 Unilateral primary osteoarthritis, right knee: Secondary | ICD-10-CM | POA: Diagnosis not present

## 2023-08-20 DIAGNOSIS — H31093 Other chorioretinal scars, bilateral: Secondary | ICD-10-CM | POA: Diagnosis not present

## 2023-08-20 DIAGNOSIS — H353131 Nonexudative age-related macular degeneration, bilateral, early dry stage: Secondary | ICD-10-CM | POA: Diagnosis not present

## 2023-08-20 DIAGNOSIS — H35353 Cystoid macular degeneration, bilateral: Secondary | ICD-10-CM | POA: Diagnosis not present

## 2023-08-20 DIAGNOSIS — H30043 Focal chorioretinal inflammation, macular or paramacular, bilateral: Secondary | ICD-10-CM | POA: Diagnosis not present

## 2023-09-03 DIAGNOSIS — H30042 Focal chorioretinal inflammation, macular or paramacular, left eye: Secondary | ICD-10-CM | POA: Diagnosis not present

## 2023-09-19 DIAGNOSIS — M545 Low back pain, unspecified: Secondary | ICD-10-CM | POA: Diagnosis not present

## 2023-09-19 DIAGNOSIS — M25561 Pain in right knee: Secondary | ICD-10-CM | POA: Diagnosis not present

## 2023-10-10 DIAGNOSIS — H31093 Other chorioretinal scars, bilateral: Secondary | ICD-10-CM | POA: Diagnosis not present

## 2023-10-10 DIAGNOSIS — H35353 Cystoid macular degeneration, bilateral: Secondary | ICD-10-CM | POA: Diagnosis not present

## 2023-10-10 DIAGNOSIS — H30043 Focal chorioretinal inflammation, macular or paramacular, bilateral: Secondary | ICD-10-CM | POA: Diagnosis not present

## 2023-10-10 DIAGNOSIS — H353131 Nonexudative age-related macular degeneration, bilateral, early dry stage: Secondary | ICD-10-CM | POA: Diagnosis not present

## 2023-10-17 DIAGNOSIS — M545 Low back pain, unspecified: Secondary | ICD-10-CM | POA: Diagnosis not present

## 2023-10-31 DIAGNOSIS — M7918 Myalgia, other site: Secondary | ICD-10-CM | POA: Diagnosis not present

## 2023-10-31 DIAGNOSIS — M25561 Pain in right knee: Secondary | ICD-10-CM | POA: Diagnosis not present

## 2023-11-01 DIAGNOSIS — M545 Low back pain, unspecified: Secondary | ICD-10-CM | POA: Diagnosis not present

## 2023-11-07 DIAGNOSIS — M545 Low back pain, unspecified: Secondary | ICD-10-CM | POA: Diagnosis not present

## 2023-11-12 DIAGNOSIS — I10 Essential (primary) hypertension: Secondary | ICD-10-CM | POA: Diagnosis not present

## 2023-11-12 DIAGNOSIS — E785 Hyperlipidemia, unspecified: Secondary | ICD-10-CM | POA: Diagnosis not present

## 2023-11-12 DIAGNOSIS — Z79899 Other long term (current) drug therapy: Secondary | ICD-10-CM | POA: Diagnosis not present

## 2023-11-12 DIAGNOSIS — M1000 Idiopathic gout, unspecified site: Secondary | ICD-10-CM | POA: Diagnosis not present

## 2023-11-12 DIAGNOSIS — G2581 Restless legs syndrome: Secondary | ICD-10-CM | POA: Diagnosis not present

## 2023-11-30 DIAGNOSIS — M47816 Spondylosis without myelopathy or radiculopathy, lumbar region: Secondary | ICD-10-CM | POA: Diagnosis not present

## 2023-12-12 DIAGNOSIS — M47816 Spondylosis without myelopathy or radiculopathy, lumbar region: Secondary | ICD-10-CM | POA: Diagnosis not present

## 2024-01-09 DIAGNOSIS — M47816 Spondylosis without myelopathy or radiculopathy, lumbar region: Secondary | ICD-10-CM | POA: Diagnosis not present

## 2024-01-22 DIAGNOSIS — M47816 Spondylosis without myelopathy or radiculopathy, lumbar region: Secondary | ICD-10-CM | POA: Diagnosis not present

## 2024-01-30 DIAGNOSIS — M4807 Spinal stenosis, lumbosacral region: Secondary | ICD-10-CM | POA: Diagnosis not present

## 2024-01-30 DIAGNOSIS — M47816 Spondylosis without myelopathy or radiculopathy, lumbar region: Secondary | ICD-10-CM | POA: Diagnosis not present

## 2024-01-30 DIAGNOSIS — M5416 Radiculopathy, lumbar region: Secondary | ICD-10-CM | POA: Diagnosis not present

## 2024-02-05 DIAGNOSIS — H35353 Cystoid macular degeneration, bilateral: Secondary | ICD-10-CM | POA: Diagnosis not present

## 2024-02-05 DIAGNOSIS — H31093 Other chorioretinal scars, bilateral: Secondary | ICD-10-CM | POA: Diagnosis not present

## 2024-02-05 DIAGNOSIS — H353131 Nonexudative age-related macular degeneration, bilateral, early dry stage: Secondary | ICD-10-CM | POA: Diagnosis not present

## 2024-02-05 DIAGNOSIS — H30043 Focal chorioretinal inflammation, macular or paramacular, bilateral: Secondary | ICD-10-CM | POA: Diagnosis not present

## 2024-02-07 DIAGNOSIS — M5416 Radiculopathy, lumbar region: Secondary | ICD-10-CM | POA: Diagnosis not present

## 2024-02-12 ENCOUNTER — Other Ambulatory Visit: Payer: Self-pay | Admitting: Internal Medicine

## 2024-02-12 DIAGNOSIS — Z1231 Encounter for screening mammogram for malignant neoplasm of breast: Secondary | ICD-10-CM

## 2024-02-25 ENCOUNTER — Ambulatory Visit
Admission: RE | Admit: 2024-02-25 | Discharge: 2024-02-25 | Disposition: A | Discharge status: Home / Self Care | Source: Ambulatory Visit | Attending: Internal Medicine | Admitting: Internal Medicine

## 2024-02-25 DIAGNOSIS — Z1231 Encounter for screening mammogram for malignant neoplasm of breast: Secondary | ICD-10-CM | POA: Diagnosis not present

## 2024-02-28 ENCOUNTER — Ambulatory Visit (INDEPENDENT_AMBULATORY_CARE_PROVIDER_SITE_OTHER): Payer: Self-pay | Admitting: Audiology

## 2024-02-28 DIAGNOSIS — H30043 Focal chorioretinal inflammation, macular or paramacular, bilateral: Secondary | ICD-10-CM | POA: Diagnosis not present

## 2024-02-28 DIAGNOSIS — H903 Sensorineural hearing loss, bilateral: Secondary | ICD-10-CM

## 2024-03-03 NOTE — Progress Notes (Signed)
  [address] [place], [place] [address] [phone]  Hearing Aid Check     Geanie Pacifico walked in to pick up hearing aid supplies.      Right Left  Hearing aid manufacturer Resound mini Omnia 9 Resound mini Omnia 9  Hearing aid style Receiver in the canal Receiver in the canal  Hearing aid Research scientist (physical sciences)      Dome/ custom earpiece Small power dome Small power dome  Retention wire no no  Warranty expiration date 08-27-2024 08-27-2024  Loss and Damage      Additional accessories Expiration date TV Streamer warranty: 08-08-2022 Charger: warranty:08-27-2024  Initial fitting date 08-10-2021 08-10-2021  Device was fit at: [facility]        Chief complaint: Patient reports needs more filters and domes.  Actions taken: Out some domes together and a pack of filters and check in gave it to her.   Services fee: $0 was paid at checkout.     Recommend: Return for a hearing aid check , as needed. Return for a hearing evaluation and to see an ENT, if concerns with hearing changes arise.    Sanaiyah Kirchhoff MARIE LEROUX-MARTINEZ, AUD

## 2024-04-14 ENCOUNTER — Ambulatory Visit (INDEPENDENT_AMBULATORY_CARE_PROVIDER_SITE_OTHER): Admitting: Audiology

## 2024-04-14 ENCOUNTER — Ambulatory Visit (INDEPENDENT_AMBULATORY_CARE_PROVIDER_SITE_OTHER): Admitting: Otolaryngology

## 2024-04-14 ENCOUNTER — Encounter (INDEPENDENT_AMBULATORY_CARE_PROVIDER_SITE_OTHER): Payer: Self-pay | Admitting: Otolaryngology

## 2024-04-14 VITALS — BP 145/76 | HR 58 | Ht 66.0 in | Wt 149.0 lb

## 2024-04-14 DIAGNOSIS — H903 Sensorineural hearing loss, bilateral: Secondary | ICD-10-CM | POA: Diagnosis not present

## 2024-04-14 DIAGNOSIS — H6123 Impacted cerumen, bilateral: Secondary | ICD-10-CM | POA: Diagnosis not present

## 2024-04-14 NOTE — Progress Notes (Signed)
 Patient ID: Ruth Murray, female   DOB: Apr 29, 1941, 83 y.o.   MRN: [id number]  Follow-up: Progressive hearing loss  HPI: The patient is an 83 year old female who presents today complaining of bilateral progressive hearing loss.  She was treated with bilateral hearing aids.  The patient returns today complaining of persistent hearing difficulty, especially in noisy environments.  Currently she denies any otalgia, otorrhea, or vertigo.  Exam: General: Communicates without difficulty, well nourished, no acute distress. Head: Normocephalic, no evidence injury, no tenderness, facial buttresses intact without stepoff. Face/sinus: No tenderness to palpation and percussion. Facial movement is normal and symmetric. Eyes: PERRL, EOMI. No scleral icterus, conjunctivae clear. Neuro: CN II exam reveals vision grossly intact.  No nystagmus at any point of gaze. Ears: Auricles well formed without lesions.  Bilateral cerumen impaction.  Nose: External evaluation reveals normal support and skin without lesions.  Dorsum is intact.  Anterior rhinoscopy reveals normal mucosa over anterior aspect of inferior turbinates and intact septum.  No purulence noted. Oral:  Oral cavity and oropharynx are intact, symmetric, without erythema or edema.  Mucosa is moist without lesions. Neck: Full range of motion without pain.  There is no significant lymphadenopathy.  No masses palpable.  Thyroid  bed within normal limits to palpation.  Parotid glands and submandibular glands equal bilaterally without mass.  Trachea is midline. Neuro:  CN 2-12 grossly intact.   Procedure: Bilateral cerumen disimpaction Anesthesia: None Description: Under the operating microscope, the cerumen is carefully removed with a combination of cerumen currette, alligator forceps, and suction catheters.  After the cerumen is removed, the TMs are noted to be normal.  No mass, erythema, or lesions. The patient tolerated the procedure well.    Assessment:   1.  Bilateral cerumen impaction.  After the disimpaction procedure, both tympanic membranes and middle ear spaces are noted to be normal. 2.  Bilateral progressive sensorineural hearing loss, likely secondary to presbycusis.  Plan: 1.  Otomicroscopy with bilateral cerumen disimpaction. 2.  The physical exam findings are reviewed with the patient. 3.  Continue with the use of her hearing aids. 4.  Repeat hearing test at her next appointment.  No audiologist is available today. 5.  The patient will return for reevaluation in 6 months.

## 2024-04-16 NOTE — Progress Notes (Signed)
  [address], [address] [place], [place] [address] [phone]  Hearing Aid Check     Ruth Murray comes for a scheduled appointment for a hearing aid check.  Accompanied ab:[id number]       Right Left  Hearing aid manufacturer Resound mini Omnia 9 Resound mini Omnia 9  Hearing aid style Receiver in the canal Receiver in the canal  Hearing aid technical brewer  Receiver      Dome/ custom earpiece Small power dome Small power dome  Retention wire no no  Warranty expiration date 08-27-2024 08-27-2024  Loss and Damage      Additional accessories Expiration date TV Streamer warranty: 08-08-2022 Charger: warranty:08-27-2024  Initial fitting date 08-10-2021 08-10-2021  Device was fit at: [facility]      Chief complaint: Patient reports needs aids to be cleaned and more supplies.  Actions taken: Inspection of the device and listening check showed that both were working well after cleaning them. Additional supplies provided.  Services fee: $0 was paid at checkout.   Recommend: Return for a hearing aid check ,as needed. Return for a hearing evaluation and to see an ENT, if concerns with hearing changes arise.    Yesica Kemler MARIE LEROUX-MARTINEZ, AUD

## 2024-04-17 ENCOUNTER — Telehealth (INDEPENDENT_AMBULATORY_CARE_PROVIDER_SITE_OTHER): Payer: Self-pay | Admitting: Audiology

## 2024-04-17 NOTE — Telephone Encounter (Signed)
 Called patient per Dr. Tex request.  Preferred home # is not in service.  LVM on mobile # requesting a call back.  Reason for the call:  to see if she wanted to schedule a Hearing Aid Check appt with Dr. Tiney late January/early February to check in and possibly sent her hearing aids out for a check up before the warranty expires early next year.

## 2024-04-18 NOTE — Telephone Encounter (Signed)
 Patient has been scheduled for 06/30/2024 for a Hearing Aid Check to get an overall check before the warranty expires.

## 2024-06-30 ENCOUNTER — Ambulatory Visit (INDEPENDENT_AMBULATORY_CARE_PROVIDER_SITE_OTHER): Payer: Self-pay | Admitting: Audiology

## 2024-07-31 ENCOUNTER — Ambulatory Visit (INDEPENDENT_AMBULATORY_CARE_PROVIDER_SITE_OTHER): Payer: Self-pay | Admitting: Audiology

## 2024-10-13 ENCOUNTER — Ambulatory Visit (INDEPENDENT_AMBULATORY_CARE_PROVIDER_SITE_OTHER): Admitting: Otolaryngology

## 2024-10-13 ENCOUNTER — Ambulatory Visit (INDEPENDENT_AMBULATORY_CARE_PROVIDER_SITE_OTHER): Admitting: Audiology
